# Patient Record
Sex: Male | Born: 1995 | Race: White | Hispanic: No | Marital: Single | Smoking: Never smoker
Health system: Southern US, Community
[De-identification: ages and names within clinical notes are randomized; demographics above are authoritative.]

## PROBLEM LIST (undated history)

## (undated) DIAGNOSIS — I639 Cerebral infarction, unspecified: Secondary | ICD-10-CM

## (undated) HISTORY — DX: Cerebral infarction, unspecified: I63.9

## (undated) HISTORY — PX: SHOULDER SURGERY: SHX246

---
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
## 2018-11-15 ENCOUNTER — Ambulatory Visit (HOSPITAL_COMMUNITY)
Admission: EM | Admit: 2018-11-15 | Discharge: 2018-11-15 | Disposition: A | Discharge status: Home / Self Care | Payer: BLUE CROSS/BLUE SHIELD | Attending: Internal Medicine | Admitting: Internal Medicine

## 2018-11-15 ENCOUNTER — Encounter (HOSPITAL_COMMUNITY): Payer: Self-pay

## 2018-11-15 ENCOUNTER — Other Ambulatory Visit: Payer: Self-pay

## 2018-11-15 DIAGNOSIS — B351 Tinea unguium: Secondary | ICD-10-CM | POA: Diagnosis not present

## 2018-11-15 MED ORDER — FLUCONAZOLE 200 MG PO TABS: 200.0000 mg | ORAL_TABLET | ORAL | 0 refills | Status: AC

## 2018-11-15 NOTE — ED Provider Notes (Signed)
[facility]    CSN: [id number] Arrival date & time: 11/15/18  1649     History   Chief Complaint Chief Complaint  Patient presents with  . Nail Problem    HPI Leonard Crawford is a 23 y.o. male.   Is a 23 year old male that presents with discoloration to the left great toe.  This has been present and worsening over the last 6 months.  He has been using over-the-counter antifungal medications without relief of symptoms.  He denies any pain or itching.  He denies any injury to the foot.  He denies any fever, chills, bites, sweats.  ROS per HPI      History reviewed. No pertinent past medical history.  There are no active problems to display for this patient.   History reviewed. No pertinent surgical history.     Home Medications    Prior to Admission medications   Medication Sig Start Date End Date Taking? Authorizing Provider  fluconazole (DIFLUCAN) 200 MG tablet Take 1 tablet (200 mg total) by mouth once a week. 11/15/18 05/02/19  Janace Aris, NP    Family History History reviewed. No pertinent family history.  Social History Social History   Tobacco Use  . Smoking status: Not on file  . Smokeless tobacco: Never Used  Substance Use Topics  . Alcohol use: Not on file  . Drug use: Not on file     Allergies   Patient has no allergy information on record.   Review of Systems Review of Systems   Physical Exam Triage Vital Signs ED Triage Vitals  Enc Vitals Group     BP 11/15/18 1751 128/89     Pulse Rate 11/15/18 1751 67     Resp 11/15/18 1751 18     Temp 11/15/18 1751 98.5 F (36.9 C)     Temp Source 11/15/18 1751 Oral     SpO2 11/15/18 1751 98 %     Weight 11/15/18 1753 240 lb (108.9 kg)     Height --      Head Circumference --      Peak Flow --      Pain Score 11/15/18 1752 1     Pain Loc --      Pain Edu? --      Excl. in GC? --    No data found.  Updated Vital Signs BP 128/89 (BP Location: Right Arm)   Pulse 67   Temp  98.5 F (36.9 C) (Oral)   Resp 18   Wt 240 lb (108.9 kg)   SpO2 98%   Visual Acuity Right Eye Distance:   Left Eye Distance:   Bilateral Distance:    Right Eye Near:   Left Eye Near:    Bilateral Near:     Physical Exam Vitals signs and nursing note reviewed.  Constitutional:      Appearance: Normal appearance.  HENT:     Head: Normocephalic and atraumatic.     Nose: Nose normal.  Eyes:     Conjunctiva/sclera: Conjunctivae normal.  Pulmonary:     Effort: Pulmonary effort is normal.  Musculoskeletal: Normal range of motion.  Skin:    General: Skin is warm and dry.     Comments: Yellow and dark discoloration to the left great toenail. No rashes, erythema.  Neurological:     Mental Status: He is alert.  Psychiatric:        Mood and Affect: Mood normal.      UC Treatments /  Results  Labs (all labs ordered are listed, but only abnormal results are displayed) Labs Reviewed - No data to display  EKG None  Radiology No results found.  Procedures Procedures (including critical care time)  Medications Ordered in UC Medications - No data to display  Initial Impression / Assessment and Plan / UC Course  I have reviewed the triage vital signs and the nursing notes.  Pertinent labs & imaging results that were available during my care of the patient were reviewed by me and considered in my medical decision making (see chart for details).     Onychomycosis We will treat with with fluconazole 2 mg once weekly for 6 months. This would be an easier treatment and cheaper for patient.  He is away at school. Instructed that his symptoms continue or do not improve with the medication he will need to follow-up with a dermatologist Final Clinical Impressions(s) / UC Diagnoses   Final diagnoses:  Onychomycosis     Discharge Instructions     We will treat you for the toenail infection with fluconazole.  You will take this medication once weekly for the next 6  months. For continued or worsening symptoms you need to follow-up with a dermatologist    ED Prescriptions    Medication Sig Dispense Auth. Provider   fluconazole (DIFLUCAN) 200 MG tablet Take 1 tablet (200 mg total) by mouth once a week. 24 tablet Dahlia ByesBast, Mont Jagoda A, NP     Controlled Substance Prescriptions [place] Controlled Substance Registry consulted? Not Applicable   Janace ArisBast, Airyanna Dipalma A, NP 11/15/18 1925

## 2018-11-15 NOTE — ED Triage Notes (Signed)
Pt cc left foot great toe nail  Issue this has been going on for months. The nail has a fungus.Leonard Crawford

## 2018-11-15 NOTE — Discharge Instructions (Signed)
We will treat you for the toenail infection with fluconazole.  You will take this medication once weekly for the next 6 months. For continued or worsening symptoms you need to follow-up with a dermatologist

## 2018-12-30 ENCOUNTER — Emergency Department (HOSPITAL_COMMUNITY): Payer: BLUE CROSS/BLUE SHIELD

## 2018-12-30 ENCOUNTER — Encounter (HOSPITAL_COMMUNITY): Payer: Self-pay | Admitting: Emergency Medicine

## 2018-12-30 ENCOUNTER — Other Ambulatory Visit: Payer: Self-pay

## 2018-12-30 ENCOUNTER — Inpatient Hospital Stay (HOSPITAL_COMMUNITY)
Admission: EM | Admit: 2018-12-30 | Discharge: 2019-01-01 | DRG: 093 | Disposition: A | Discharge status: Home / Self Care | Payer: BLUE CROSS/BLUE SHIELD | Attending: Internal Medicine | Admitting: Internal Medicine

## 2018-12-30 DIAGNOSIS — R51 Headache: Secondary | ICD-10-CM | POA: Diagnosis present

## 2018-12-30 DIAGNOSIS — R112 Nausea with vomiting, unspecified: Secondary | ICD-10-CM | POA: Diagnosis present

## 2018-12-30 DIAGNOSIS — E669 Obesity, unspecified: Secondary | ICD-10-CM | POA: Diagnosis present

## 2018-12-30 DIAGNOSIS — E860 Dehydration: Secondary | ICD-10-CM | POA: Diagnosis present

## 2018-12-30 DIAGNOSIS — G08 Intracranial and intraspinal phlebitis and thrombophlebitis: Secondary | ICD-10-CM | POA: Diagnosis present

## 2018-12-30 DIAGNOSIS — R030 Elevated blood-pressure reading, without diagnosis of hypertension: Secondary | ICD-10-CM | POA: Diagnosis present

## 2018-12-30 DIAGNOSIS — F17290 Nicotine dependence, other tobacco product, uncomplicated: Secondary | ICD-10-CM | POA: Diagnosis present

## 2018-12-30 DIAGNOSIS — Z6832 Body mass index (BMI) 32.0-32.9, adult: Secondary | ICD-10-CM | POA: Diagnosis not present

## 2018-12-30 DIAGNOSIS — R001 Bradycardia, unspecified: Secondary | ICD-10-CM | POA: Diagnosis not present

## 2018-12-30 DIAGNOSIS — I82409 Acute embolism and thrombosis of unspecified deep veins of unspecified lower extremity: Secondary | ICD-10-CM | POA: Diagnosis present

## 2018-12-30 DIAGNOSIS — Z79899 Other long term (current) drug therapy: Secondary | ICD-10-CM | POA: Diagnosis not present

## 2018-12-30 DIAGNOSIS — Z87820 Personal history of traumatic brain injury: Secondary | ICD-10-CM

## 2018-12-30 LAB — BASIC METABOLIC PANEL
ANION GAP: 9 (ref 5–15)
BUN: 9 mg/dL (ref 6–20)
CO2: 26 mmol/L (ref 22–32)
Calcium: 9.7 mg/dL (ref 8.9–10.3)
Chloride: 105 mmol/L (ref 98–111)
Creatinine, Ser: 1.04 mg/dL (ref 0.61–1.24)
GFR calc Af Amer: >60 mL/min (ref >60)
GFR calc non Af Amer: >60 mL/min (ref >60)
Glucose, Bld: 99 mg/dL (ref 70–99)
Potassium: 3.7 mmol/L (ref 3.5–5.1)
Sodium: 140 mmol/L (ref 135–145)

## 2018-12-30 LAB — CBC
HCT: 46.3 % (ref 39.0–52.0)
Hemoglobin: 15.5 g/dL (ref 13.0–17.0)
MCH: 28.5 pg (ref 26.0–34.0)
MCHC: 33.5 g/dL (ref 30.0–36.0)
MCV: 85.1 fL (ref 80.0–100.0)
NRBC: 0 % (ref 0.0–0.2)
Platelets: 226 10*3/uL (ref 150–400)
RBC: 5.44 MIL/uL (ref 4.22–5.81)
RDW: 12.6 % (ref 11.5–15.5)
WBC: 9.5 10*3/uL (ref 4.0–10.5)

## 2018-12-30 MED ORDER — HEPARIN (PORCINE) 25000 UT/250ML-% IV SOLN: 12.0000 [IU]/kg/h | INTRAVENOUS | Status: DC

## 2018-12-30 MED ORDER — PROCHLORPERAZINE EDISYLATE 10 MG/2ML IJ SOLN
10.0000 mg | Freq: Once | INTRAMUSCULAR | Status: AC
  Administered 2018-12-30: 10 mg via INTRAVENOUS
  Filled 2018-12-30: qty 2

## 2018-12-30 MED ORDER — DIPHENHYDRAMINE HCL 50 MG/ML IJ SOLN
12.5000 mg | Freq: Once | INTRAMUSCULAR | Status: AC
  Administered 2018-12-30: 12.5 mg via INTRAVENOUS
  Filled 2018-12-30: qty 1

## 2018-12-30 MED ORDER — IOHEXOL 300 MG/ML  SOLN
75.0000 mL | Freq: Once | INTRAMUSCULAR | Status: AC | PRN
  Administered 2018-12-30: 75 mL via INTRAVENOUS

## 2018-12-30 MED ORDER — KETOROLAC TROMETHAMINE 30 MG/ML IJ SOLN
30.0000 mg | Freq: Once | INTRAMUSCULAR | Status: AC
  Administered 2018-12-30: 30 mg via INTRAVENOUS
  Filled 2018-12-30: qty 1

## 2018-12-30 MED ORDER — HEPARIN (PORCINE) 25000 UT/250ML-% IV SOLN
1400.0000 [IU]/h | INTRAVENOUS | Status: DC
  Administered 2018-12-30: 1200 [IU]/h via INTRAVENOUS
  Administered 2018-12-31: 1400 [IU]/h via INTRAVENOUS
  Filled 2018-12-30 (×2): qty 250

## 2018-12-30 NOTE — ED Triage Notes (Signed)
Patient here with c/o severe HA with blurred vision today. Patient states he hit his head on his friends knee about a week ago and has been having migranes since Tuesday. HA Pain 7/10 at this time. Patient did not LOC when he hit his head, otherwise AOX4.

## 2018-12-30 NOTE — ED Notes (Signed)
Patient transported to CT 

## 2018-12-30 NOTE — Consult Note (Addendum)
NEURO HOSPITALIST CONSULT NOTE   Requestig physician: Dr. Lynelle Doctor  Reason for Consult: Venous sinus thromboses on CT venogram of head  History obtained from:  Patient and Chart     HPI:                                                                                                                                          Leonard Crawford is an 23 y.o. male presenting to the ED after experiencing severe 10/10 headache retroorbitally with onset about 1.5 days after striking the back of his head. The accident occurred about 1 week ago. His head had struck a friend's knee after falling backwards from a standing position during "horseplay". There was no LOC or change in mentation after striking his head. He thought that the headache was due to a migraine. However, he developed blurred vision today which prompted his visit to the ED.   CT head in the ED showed increased density within the posterior superior sagittal sinus, straight sinus, and right transverse sinus, which were felt to possibly represent dural venous sinus thrombosis. No parenchymal lesion was appreciated.   CTV of head was then obtained, confirming extensive venous thrombosis, involving the superior sagittal sinus, straight sinus, both transverse sinuses, the left sigmoid sinus, the left internal jugular vein and the left paravertebral vein.  Electrolyte panel and CBC in the ED were normal.   History reviewed. No pertinent past medical history.  History reviewed. No pertinent surgical history.  History reviewed. No pertinent family history.            Social History:  does not have a smoking history on file. He has never used smokeless tobacco. No history on file for alcohol and drug.  No Known Allergies  MEDICATIONS:                                                                                                                     Excedrin Migraine Diflucan Advil   ROS:  Positive for 4/10 headache behind eyes. No neck pain. Has had some trouble with high acuity visual tasks such as reading iPhone display. No CP, fever, SOB, dizziness, vertigo, confusion, trouble with speech, limb weakness, limb pain, limb numbness or urinary/bowel symptoms. Other ROS as per HPI.    Blood pressure (!) 122/55, pulse (!) 51, resp. rate 16, height 6' (1.829 m), weight 108.9 kg, SpO2 98 %.   General Examination:                                                                                                       Physical Exam  HEENT-  Normocephalic   Lungs- Respirations unlabored Extremities- No edema  Neurological Examination Mental Status: Alert, fully oriented, thought content appropriate.  Speech fluent with intact naming and comprehension. Basic math intact. No dysarthria. Pleasant and cooperative.  Cranial Nerves: II: Visual fields intact with no extinction to DSS. PERRL.   III,IV, VI: EOM are full. There consistently are about 5-6 beats of rapid low amplitude pendular nystagmus of left eye with repeated testing of upgaze. No ptosis.  V,VII: Smile symmetric, facial temp sensation normal bilaterally VIII: hearing intact to voice IX,X: Palate rises symmetrically XI: Symmetric XII: midline tongue extension Motor: Right : Upper extremity   5/5    Left:     Upper extremity   5/5  Lower extremity   5/5     Lower extremity   5/5 Normal tone throughout; no atrophy noted No pronator drift Sensory: Temp and light touch intact x 4 without extinction Deep Tendon Reflexes: 2+ and symmetric biceps, brachioradialis, patellae and achilles Plantars: Right: downgoing   Left: downgoing Cerebellar: No ataxia with FNF and H-S bilaterally Gait: Deferred   Lab Results: Basic Metabolic Panel: Recent Labs  Lab 12/30/18 2050  NA 140  K 3.7  CL 105  CO2 26  GLUCOSE 99  BUN 9   CREATININE 1.04  CALCIUM 9.7    CBC: Recent Labs  Lab 12/30/18 2050  WBC 9.5  HGB 15.5  HCT 46.3  MCV 85.1  PLT 226    Cardiac Enzymes: No results for input(s): CKTOTAL, CKMB, CKMBINDEX, TROPONINI in the last 168 hours.  Lipid Panel: No results for input(s): CHOL, TRIG, HDL, CHOLHDL, VLDL, LDLCALC in the last 168 hours.  Imaging: Ct Head Wo Contrast  Result Date: 12/30/2018 CLINICAL DATA:  23 y/o  M; severe persistent headache. EXAM: CT HEAD WITHOUT CONTRAST TECHNIQUE: Contiguous axial images were obtained from the base of the skull through the vertex without intravenous contrast. COMPARISON:  None. FINDINGS: Brain: No evidence of acute infarction, hemorrhage, hydrocephalus, extra-axial collection or mass lesion/mass effect. Vascular: Asymmetric increased density within the posterior superior sagittal sinus, straight sinus, and right transverse sinus. Skull: Normal. Negative for fracture or focal lesion. Sinuses/Orbits: No acute finding. Other: None. IMPRESSION: 1. No evidence of acute intracranial abnormality. 2. Increased density within the posterior superior sagittal sinus, straight sinus, and right transverse sinus. Findings may represent dural venous sinus thrombosis. Head CTV or MRV is recommended. These results were called by telephone at the  time of interpretation on 12/30/2018 at 8:27 pm to Dr. JON KNAPP , who verbally acknowledged these results. Electronically Signed   By: Lance  Furusawa-Stratton M.D.   On: 12/30/2018 20:28   Ct Venogram Head  Result Date: 12/30/2018 CLINICAL DATA:  Headache with blurry vision. Migraines. Recent head trauma. EXAM: CT VENOGRAM HEAD TECHNIQUE: Vena graphic images of the head were obtained following administration of intravenous contrast. CONTRAST:  [id number] CountyJesse Brown Va Medical Center - Va ChiDurene CAAvenues Surgical CMinerva AreolVerdHermitage Tn Endoscopy Asc LL469-[facility] [id number] Center OTDurene CASnoqualmie Valley HosMinerva AreolVerdAspirus Keweenaw Hospital(365) [id number] HoLake CountrDurene CACypress Pointe Surgical HosMinerva AreolVerdAdvanced Surgery Center Of Lancaste44Pmg Kaseman [id number] Behavioral HeaSouthDurene CAVision Group AsMinerva AreolVerdDoctors Outpatient Center For Sur986 Shriners' Hospital For [id number] Hospital Durene CASt. Louis Children'S HosMinerva AreolVerdU35[facility]5725Lawera31mc829Three Rivers EnMayo Clinic Health SystemDurene CANorwood HltMinerva AreolVerdWilliam J Mccord Adolescent Treatment920-[facility] [id number]-FairvieThe Rehabilitation Durene CARiverview Regional Medical CMinerva AreolVerdSouthwest Florida Institute Of Ambulatory 72[facility] 5467Lawera50Staten IslSwedish Medical Center - RedmoMinerva Areol82md829Fillmore CommunBaptist Memorial HoDurene CAEast Alabama Medical CMinerva AreolVerdSt. Francis Medic843-Speciality [facility] [id number] Island UniversitUhs BinghDurene CALogan Memorial HosMinerva AreolVerdMadison Hosp608 [facility]8134Lawera38mc829MinneolaHoly Redeemer AmbulatDurene CAOutpatient Surgery Center Of JonesborMinerva AreolVerdReading640 [facility] [id number] OutpatiSouthern California HDurene CANortheast Alabama Regional Medical CMinerva AreolVerdCommunity Medical Center(571)[facility]53Lawera65mDurene CAEncompass Health Rehabilitation Hospital Of Minerva AreolVerdTexas Health Surgery Cente501-[facility] Simi Vall5985Lawerance Cruel33ovanniapletal0 MG/ML  SOLN COMPARISON:  Head CT 12/30/2018 FINDINGS: There is a large filling defect within the posterior superior sagittal sinus and within the straight sinus. The  superior sagittal sinus has expanded in size since the earlier examination. Filling defect extends into both transverse sinuses and the left sigmoid sinus. Additionally, there central filling defects within the left internal jugular vein and left paravertebral vein (series 3, image 10). There is normal opacification of the anterior half of superior sagittal sinus. IMPRESSION: Confirmation of extensive venous thrombosis, involving the superior sagittal sinus, straight sinus, both transverse sinuses, the left sigmoid sinus, the left internal jugular vein and the left paravertebral vein. Critical Value/emergent results were called by telephone at the time of interpretation on 12/30/2018 at 10:41 pm to Dr. JON KNAPP , who verbally acknowledged these results. Electronically Signed   By: Kevin  Herman M.D.   On: 12/30/2018 22:41    Assessment: 23 year old male with extensive venous thrombosis, involving the superior sagittal sinus, straight sinus, both transverse sinuses, the left sigmoid sinus, the left internal jugular vein and the left paravertebral vein. 1. Thrombosis most likely precipitated by fall during which he struck his head approximately 7 days ago 2. Neurological exam without deficits except for pendular nystagmus of left eye with upgaze.   Recommendations: 1. Hypercoagulable panel 2. Lupus panel 3. Start heparin with no bolus. Will subsequently need to be started on an oral anticoagulant.  4. MRI brain with and without contrast 5. Frequent neuro checks 6. Headache management PRN 7. Stroke team to follow in AM.   Electronically signed: Dr. Kito Cuffe 12/30/2018, 10:54 PM

## 2018-12-30 NOTE — ED Provider Notes (Signed)
[facility] EMERGENCY DEPARTMENT Provider Note   CSN: [id number] Arrival date & time: 12/30/18  1920    History   Chief Complaint Headache  HPI Leonard Crawford is a 23 y.o. male.     HPI Patient presents to the emergency room for evaluation of a headache.  Patient states initially the symptoms started a couple weeks ago.  He was wrestling and got kneed in the head.  Patient states ever since then he has had a persistent headache.  A few days ago became more severe where he had some photophobia and visual disturbance.  Headaches in the frontal area.  Does not radiate.  He has no neck stiffness.  No numbness or weakness.  Patient does have a history of recurrent headaches although he is never been formally diagnosed with migraines.  Patient usually will have a headache started to come on and he can take some ibuprofen and take a nap and the headache will go away.  Went to an urgent care this evening and they recommended a head CT because of his recent head injury. History reviewed. No pertinent past medical history.  Patient Active Problem List   Diagnosis Date Noted  . DVT (deep venous thrombosis) (HCC) 12/30/2018    History reviewed. No pertinent surgical history.      Home Medications    Prior to Admission medications   Medication Sig Start Date End Date Taking? Authorizing Provider  aspirin-acetaminophen-caffeine (EXCEDRIN MIGRAINE) [phone] MG tablet Take 2 tablets by mouth every 6 (six) hours as needed for headache or migraine.   Yes [provider]  fluconazole (DIFLUCAN) 200 MG tablet Take 1 tablet (200 mg total) by mouth once a week. Patient taking differently: Take 200 mg by mouth every Monday.  11/15/18 05/02/19 Yes Bast, Traci A, NP  ibuprofen (ADVIL,MOTRIN) 200 MG tablet Take 800 mg by mouth every 6 (six) hours as needed (for pain or migraines).   Yes [provider]    Family History History reviewed. No pertinent family  history.  Social History Social History   Tobacco Use  . Smoking status: Not on file  . Smokeless tobacco: Never Used  Substance Use Topics  . Alcohol use: Not on file  . Drug use: Not on file     Allergies   Patient has no known allergies.   Review of Systems Review of Systems  All other systems reviewed and are negative.    Physical Exam Updated Vital Signs BP (!) 122/55   Pulse (!) 51   Resp 16   Ht 1.829 m (6')   Wt 108.9 kg   SpO2 98%   BMI 32.55 kg/m   Physical Exam Vitals signs and nursing note reviewed.  Constitutional:      General: He is not in acute distress.    Appearance: He is well-developed.  HENT:     Head: Normocephalic and atraumatic.     Right Ear: External ear normal.     Left Ear: External ear normal.  Eyes:     General: No scleral icterus.       Right eye: No discharge.        Left eye: No discharge.     Conjunctiva/sclera: Conjunctivae normal.  Neck:     Musculoskeletal: Neck supple.     Trachea: No tracheal deviation.  Cardiovascular:     Rate and Rhythm: Normal rate and regular rhythm.  Pulmonary:     Effort: Pulmonary effort is normal. No respiratory distress.  Breath sounds: Normal breath sounds. No stridor. No wheezing or rales.  Abdominal:     General: Bowel sounds are normal. There is no distension.     Palpations: Abdomen is soft.     Tenderness: There is no abdominal tenderness. There is no guarding or rebound.  Musculoskeletal:        General: No tenderness.  Skin:    General: Skin is warm and dry.     Findings: No rash.  Neurological:     Mental Status: He is alert.     Cranial Nerves: No cranial nerve deficit (no facial droop, extraocular movements intact, no slurred speech).     Sensory: No sensory deficit.     Motor: No abnormal muscle tone or seizure activity.     Coordination: Coordination normal.      ED Treatments / Results  Labs (all labs ordered are listed, but only abnormal results are  displayed) Labs Reviewed  CBC  BASIC METABOLIC PANEL  ANTITHROMBIN III  PROTEIN C ACTIVITY  PROTEIN C, TOTAL  PROTEIN S ACTIVITY  PROTEIN S, TOTAL  LUPUS ANTICOAGULANT PANEL  BETA-2-GLYCOPROTEIN I ABS, IGG/M/A  HOMOCYSTEINE  FACTOR 5 LEIDEN  PROTHROMBIN GENE MUTATION  CARDIOLIPIN ANTIBODIES, IGG, IGM, IGA  PROTIME-INR  APTT  HEPARIN LEVEL (UNFRACTIONATED)  CBC    EKG None  Radiology Ct Head Wo Contrast  Result Date: 12/30/2018 CLINICAL DATA:  23 y/o  M; severe persistent headache. EXAM: CT HEAD WITHOUT CONTRAST TECHNIQUE: Contiguous axial images were obtained from the base of the skull through the vertex without intravenous contrast. COMPARISON:  None. FINDINGS: Brain: No evidence of acute infarction, hemorrhage, hydrocephalus, extra-axial collection or mass lesion/mass effect. Vascular: Asymmetric increased density within the posterior superior sagittal sinus, straight sinus, and right transverse sinus. Skull: Normal. Negative for fracture or focal lesion. Sinuses/Orbits: No acute finding. Other: None. IMPRESSION: 1. No evidence of acute intracranial abnormality. 2. Increased density within the posterior superior sagittal sinus, straight sinus, and right transverse sinus. Findings may represent dural venous sinus thrombosis. Head CTV or MRV is recommended. These results were called by telephone at the time of interpretation on 12/30/2018 at 8:27 pm to Dr. Linwood Dibbles , who verbally acknowledged these results. Electronically Signed   By: Mitzi Hansen M.D.   On: 12/30/2018 20:28   Ct Venogram Head  Result Date: 12/30/2018 CLINICAL DATA:  Headache with blurry vision. Migraines. Recent head trauma. EXAM: CT VENOGRAM HEAD TECHNIQUE: Vena graphic images of the head were obtained following administration of intravenous contrast. CONTRAST:  38mL OMNIPAQUE IOHEXOL 300 MG/ML  SOLN COMPARISON:  Head CT 12/30/2018 FINDINGS: There is a large filling defect within the posterior superior  sagittal sinus and within the straight sinus. The superior sagittal sinus has expanded in size since the earlier examination. Filling defect extends into both transverse sinuses and the left sigmoid sinus. Additionally, there central filling defects within the left internal jugular vein and left paravertebral vein (series 3, image 10). There is normal opacification of the anterior half of superior sagittal sinus. IMPRESSION: Confirmation of extensive venous thrombosis, involving the superior sagittal sinus, straight sinus, both transverse sinuses, the left sigmoid sinus, the left internal jugular vein and the left paravertebral vein. Critical Value/emergent results were called by telephone at the time of interpretation on 12/30/2018 at 10:41 pm to Dr. Linwood Dibbles , who verbally acknowledged these results. Electronically Signed   By: Deatra Robinson M.D.   On: 12/30/2018 22:41    Procedures .Critical Care Performed by: Lynelle Doctor,  Cletis AthensJon, MD Authorized by: Linwood DibblesKnapp, Janai Maudlin, MD   Critical care provider statement:    Critical care time (minutes):  30   Critical care was time spent personally by me on the following activities:  Discussions with consultants, evaluation of patient's response to treatment, examination of patient, ordering and performing treatments and interventions, ordering and review of laboratory studies, ordering and review of radiographic studies, pulse oximetry, re-evaluation of patient's condition, obtaining history from patient or surrogate and review of old charts   (including critical care time)  Medications Ordered in ED Medications  heparin ADULT infusion 100 units/mL (25000 units/23850mL sodium chloride 0.45%) (has no administration in time range)  prochlorperazine (COMPAZINE) injection 10 mg (10 mg Intravenous Given 12/30/18 1951)  diphenhydrAMINE (BENADRYL) injection 12.5 mg (12.5 mg Intravenous Given 12/30/18 1951)  ketorolac (TORADOL) 30 MG/ML injection 30 mg (30 mg Intravenous Given 12/30/18  2119)  iohexol (OMNIPAQUE) 300 MG/ML solution 75 mL (75 mLs Intravenous Contrast Given 12/30/18 2205)     Initial Impression / Assessment and Plan / ED Course  I have reviewed the triage vital signs and the nursing notes.  Pertinent labs & imaging results that were available during my care of the patient were reviewed by me and considered in my medical decision making (see chart for details).  Clinical Course as of Dec 30 2306  Thu Dec 30, 2018  2254 Discussed the case with Dr. Otelia LimesLindzen.  He recommends heparin infusion without bolus   [JK]  2307 Discussed case with Dr. Toniann FailKakrakandy   [JK]    Clinical Course User Index [JK] Linwood DibblesKnapp, Mozelle Remlinger, MD     Patient presented to the emergency room for evaluation of a headache.  Patient symptoms were suggestive of possible migraine type headache with the photophobia and visual aura however he did have a recent head injury which precipitated this headache.  Because of the persistent headache after a traumatic head injury CT scan was performed.  CT scan suggest the possibility of venous thrombosis.  CT venogram was ordered and this does confirm extensive venous thrombosis.  Patient does not have any known underlying medical conditions to precipitate this.  He will need a hypercoagulability work-up.  IV heparin has been ordered.  Patient will be admitted to the hospital for further treatment and evaluation. Final Clinical Impressions(s) / ED Diagnoses   Final diagnoses:  Cerebral venous thrombosis      Linwood DibblesKnapp, Terre Hanneman, MD 12/30/18 2308

## 2018-12-30 NOTE — Progress Notes (Signed)
ANTICOAGULATION CONSULT NOTE - Initial Consult  Pharmacy Consult for Heparin Indication: cerebral venous thrombosis  No Known Allergies  Patient Measurements: Height: 6' (182.9 cm) Weight: 240 lb (108.9 kg) IBW/kg (Calculated) : 77.6 Heparin Dosing Weight: 101 kg  Vital Signs: BP: 122/55 (02/20 2115) Pulse Rate: 51 (02/20 2115)  Labs: Recent Labs    12/30/18 2050  HGB 15.5  HCT 46.3  PLT 226  CREATININE 1.04    Estimated Creatinine Clearance: 143.2 mL/min (by C-G formula based on SCr of 1.04 mg/dL).   Medical History: History reviewed. No pertinent past medical history.  Medications:  See electronic med rec  Assessment: 23 y.o. presents with headache. Found to have extensive cerebral vein thrombosis on CT venogram. To begin heparin with no bolus and lower goal rate of 0.3-0.5 units/ml. No AC PTA. CBC stable.  Goal of Therapy:  Heparin level 0.3-0.5 units/ml Monitor platelets by anticoagulation protocol: Yes   Plan:  Heparin 1200 units/hr  Will f/u 8 hr heparin level Daily heparin level and CBC  Christoper Fabian, PharmD, BCPS Clinical pharmacist  **Pharmacist phone directory can now be found on [email or web address] ([email or web address]).  Listed under [facility]. 12/30/2018,11:03 PM

## 2018-12-31 ENCOUNTER — Encounter (HOSPITAL_COMMUNITY): Payer: Self-pay | Admitting: Internal Medicine

## 2018-12-31 ENCOUNTER — Inpatient Hospital Stay (HOSPITAL_COMMUNITY): Payer: BLUE CROSS/BLUE SHIELD

## 2018-12-31 DIAGNOSIS — G08 Intracranial and intraspinal phlebitis and thrombophlebitis: Secondary | ICD-10-CM

## 2018-12-31 DIAGNOSIS — R030 Elevated blood-pressure reading, without diagnosis of hypertension: Secondary | ICD-10-CM | POA: Diagnosis present

## 2018-12-31 LAB — PROTIME-INR
INR: 1.08
Prothrombin Time: 13.9 seconds (ref 11.4–15.2)

## 2018-12-31 LAB — HEPATIC FUNCTION PANEL
ALK PHOS: 74 U/L (ref 38–126)
ALT: 30 U/L (ref 0–44)
AST: 21 U/L (ref 15–41)
Albumin: 3.9 g/dL (ref 3.5–5.0)
Bilirubin, Direct: 0.1 mg/dL (ref 0.0–0.2)
Indirect Bilirubin: 0.3 mg/dL (ref 0.3–0.9)
Total Bilirubin: 0.4 mg/dL (ref 0.3–1.2)
Total Protein: 7 g/dL (ref 6.5–8.1)

## 2018-12-31 LAB — CBC
HCT: 42.5 % (ref 39.0–52.0)
HEMOGLOBIN: 14.5 g/dL (ref 13.0–17.0)
MCH: 28.8 pg (ref 26.0–34.0)
MCHC: 34.1 g/dL (ref 30.0–36.0)
MCV: 84.3 fL (ref 80.0–100.0)
NRBC: 0 % (ref 0.0–0.2)
Platelets: 196 10*3/uL (ref 150–400)
RBC: 5.04 MIL/uL (ref 4.22–5.81)
RDW: 12.5 % (ref 11.5–15.5)
WBC: 6.5 10*3/uL (ref 4.0–10.5)

## 2018-12-31 LAB — BASIC METABOLIC PANEL
Anion gap: 9 (ref 5–15)
BUN: 12 mg/dL (ref 6–20)
CO2: 27 mmol/L (ref 22–32)
Calcium: 9.4 mg/dL (ref 8.9–10.3)
Chloride: 104 mmol/L (ref 98–111)
Creatinine, Ser: 1.05 mg/dL (ref 0.61–1.24)
GFR calc Af Amer: >60 mL/min (ref >60)
GFR calc non Af Amer: >60 mL/min (ref >60)
Glucose, Bld: 124 mg/dL — ABNORMAL HIGH (ref 70–99)
Potassium: 4 mmol/L (ref 3.5–5.1)
Sodium: 140 mmol/L (ref 135–145)

## 2018-12-31 LAB — HIV ANTIBODY (ROUTINE TESTING W REFLEX): HIV Screen 4th Generation wRfx: NONREACTIVE

## 2018-12-31 LAB — HEPARIN LEVEL (UNFRACTIONATED)
Heparin Unfractionated: 0.23 IU/mL — ABNORMAL LOW (ref 0.30–0.70)
Heparin Unfractionated: 0.38 IU/mL (ref 0.30–0.70)
Heparin Unfractionated: 0.36 IU/mL (ref 0.30–0.70)

## 2018-12-31 LAB — ANTITHROMBIN III: AntiThromb III Func: 104 % (ref 75–120)

## 2018-12-31 LAB — APTT: aPTT: 32 seconds (ref 24–36)

## 2018-12-31 MED ORDER — SODIUM CHLORIDE 0.9 % IV SOLN
INTRAVENOUS | Status: DC
  Administered 2018-12-31: 01:00:00 via INTRAVENOUS

## 2018-12-31 MED ORDER — FLUCONAZOLE 100 MG PO TABS: 200.0000 mg | ORAL_TABLET | ORAL | Status: DC

## 2018-12-31 MED ORDER — TRAMADOL HCL 50 MG PO TABS
50.0000 mg | ORAL_TABLET | Freq: Four times a day (QID) | ORAL | Status: DC | PRN
  Administered 2018-12-31 – 2019-01-01 (×3): 50 mg via ORAL
  Filled 2018-12-31 (×3): qty 1

## 2018-12-31 MED ORDER — ZOLPIDEM TARTRATE 5 MG PO TABS
5.0000 mg | ORAL_TABLET | Freq: Once | ORAL | Status: AC
  Administered 2018-12-31: 5 mg via ORAL
  Filled 2018-12-31: qty 1

## 2018-12-31 MED ORDER — ACETAMINOPHEN 325 MG PO TABS: 650.0000 mg | ORAL_TABLET | Freq: Four times a day (QID) | ORAL | Status: DC | PRN

## 2018-12-31 MED ORDER — ACETAMINOPHEN 650 MG RE SUPP: 650.0000 mg | Freq: Four times a day (QID) | RECTAL | Status: DC | PRN

## 2018-12-31 MED ORDER — GADOBUTROL 1 MMOL/ML IV SOLN
10.0000 mL | Freq: Once | INTRAVENOUS | Status: AC | PRN
  Administered 2018-12-31: 10 mL via INTRAVENOUS

## 2018-12-31 MED ORDER — ONDANSETRON HCL 4 MG/2ML IJ SOLN
4.0000 mg | Freq: Four times a day (QID) | INTRAMUSCULAR | Status: DC | PRN
  Administered 2019-01-01: 4 mg via INTRAVENOUS
  Filled 2018-12-31: qty 2

## 2018-12-31 MED ORDER — LORAZEPAM 2 MG/ML IJ SOLN
1.0000 mg | Freq: Once | INTRAMUSCULAR | Status: AC
  Administered 2018-12-31: 1 mg via INTRAVENOUS
  Filled 2018-12-31: qty 1

## 2018-12-31 MED ORDER — SODIUM CHLORIDE 0.9 % IV SOLN
INTRAVENOUS | Status: AC
  Administered 2018-12-31 – 2019-01-01 (×2): via INTRAVENOUS

## 2018-12-31 MED ORDER — KETOROLAC TROMETHAMINE 15 MG/ML IJ SOLN
15.0000 mg | Freq: Four times a day (QID) | INTRAMUSCULAR | Status: AC | PRN
  Administered 2018-12-31 (×3): 15 mg via INTRAVENOUS
  Filled 2018-12-31 (×2): qty 1

## 2018-12-31 MED ORDER — ONDANSETRON HCL 4 MG PO TABS: 4.0000 mg | ORAL_TABLET | Freq: Four times a day (QID) | ORAL | Status: DC | PRN

## 2018-12-31 NOTE — Care Management (Signed)
#    7.   S/W [facility] @ [facility]  # [phone] OPT- MEMBER   PRADAXA  150 MG BID COVER- NOT COVER PRIOR APPROVAL- YES # [phone]   PREFERRED ALTERNATIVE: 1. ELIQUIS  2.5 MG  BID   COVER- YES CO-PAY- $ 40.00 TIER- PREFERRED PRIOR APPROVAL- NO  2. ELIQUIS  5 MG BID COVER- YES CO-PAY- $ 40.00 TIER- PREFERRED PRIOR APPROVAL- NO  3. XARELTO 15 MG BID  COVER- YES CO-PAY- $ 40.00  TIER- PREFERRED  PRIOR APPROVAL- NO  4. XARELTO  20 MG DAILY COVER- YES CO-PAY-$ 40.00 TIER-PREFERRED PRIOR APPROVAL- NO   PREFERRED PHARMACY : YES [facility]

## 2018-12-31 NOTE — H&P (Addendum)
History and Physical    Leonard DuckingSeth A Meckley UJW:[id number]:[id number] DOB: [birth year] DOA: 12/30/2018  PCP: Patient, No Pcp Per  Patient coming from: Home.  Chief Complaint: Headache.  HPI: Leonard Crawford is a 23 y.o. male with history of migraine presents to the ER because of persistent headache nausea vomiting.  Patient hit his head about 2-1/2 weeks ago while playing with his friends.  Since then he has been having some occipital headache.  Over the last few days the headache worsened mostly in the retro-orbital area with persistent nausea vomiting which was not getting better and was more than his usual headache from migraine.  ED Course: In the ER patient appeared nonfocal.  CT head showed features concerning for venous sinus thrombosis for which CT venogram was done.  Confirmed venous sinus thrombosis.  On-call neurologist Dr. Caryl PinaEric Lindzen was consulted and patient was started on heparin infusion.  For headache patient was given Toradol which improved the headache.  Review of Systems: As per HPI, rest all negative.   History reviewed. No pertinent past medical history.  History reviewed. No pertinent surgical history.   reports that he has never smoked. He has never used smokeless tobacco. No history on file for alcohol and drug.  No Known Allergies  Family history -maternal grandfather had coronary artery disease.  Aunt had DVT when she was wearing a cast.  Prior to Admission medications   Medication Sig Start Date End Date Taking? Authorizing Provider  aspirin-acetaminophen-caffeine (EXCEDRIN MIGRAINE) [id number] MG tablet Take 2 tablets by mouth every 6 (six) hours as needed for headache or migraine.   Yes [provider]  fluconazole (DIFLUCAN) 200 MG tablet Take 1 tablet (200 mg total) by mouth once a week. Patient taking differently: Take 200 mg by mouth every Monday.  11/15/18 05/02/19 Yes Bast, Traci A, NP  ibuprofen (ADVIL,MOTRIN) 200 MG tablet Take 800 mg by mouth every 6 (six)  hours as needed (for pain or migraines).   Yes [provider]    Physical Exam: Vitals:   12/30/18 2115 12/30/18 2319 12/30/18 2320 12/31/18 0003  BP: (!) 122/55 (!) 159/82  (!) 161/94  Pulse: (!) 51  63 65  Resp: 16   20  Temp:    98 F (36.7 C)  TempSrc:    Oral  SpO2: 98%  98% 95%  Weight:      Height:          Constitutional: Moderately built and nourished. Vitals:   12/30/18 2115 12/30/18 2319 12/30/18 2320 12/31/18 0003  BP: (!) 122/55 (!) 159/82  (!) 161/94  Pulse: (!) 51  63 65  Resp: 16   20  Temp:    98 F (36.7 C)  TempSrc:    Oral  SpO2: 98%  98% 95%  Weight:      Height:       Eyes: Anicteric no pallor. ENMT: No discharge from the ears eyes nose and mouth. Neck: No mass felt.  No neck rigidity. Respiratory: No rhonchi or crepitations. Cardiovascular: S1-S2 heard. Abdomen: Soft nontender bowel sounds present. Musculoskeletal: No edema.  No joint effusion. Skin: No rash. Neurologic: Alert awake oriented to time place and person.  Moves all extremities. Psychiatric: Appears normal per normal affect.   Labs on Admission: I have personally reviewed following labs and imaging studies  CBC: Recent Labs  Lab 12/30/18 2050  WBC 9.5  HGB 15.5  HCT 46.3  MCV 85.1  PLT 226   Basic Metabolic Panel:  Recent Labs  Lab 12/30/18 2050  NA 140  K 3.7  CL 105  CO2 26  GLUCOSE 99  BUN 9  CREATININE 1.04  CALCIUM 9.7   GFR: Estimated Creatinine Clearance: 143.2 mL/min (by C-G formula based on SCr of 1.04 mg/dL). Liver Function Tests: No results for input(s): AST, ALT, ALKPHOS, BILITOT, PROT, ALBUMIN in the last 168 hours. No results for input(s): LIPASE, AMYLASE in the last 168 hours. No results for input(s): AMMONIA in the last 168 hours. Coagulation Profile: No results for input(s): INR, PROTIME in the last 168 hours. Cardiac Enzymes: No results for input(s): CKTOTAL, CKMB, CKMBINDEX, TROPONINI in the last 168 hours. BNP (last 3  results) No results for input(s): PROBNP in the last 8760 hours. HbA1C: No results for input(s): HGBA1C in the last 72 hours. CBG: No results for input(s): GLUCAP in the last 168 hours. Lipid Profile: No results for input(s): CHOL, HDL, LDLCALC, TRIG, CHOLHDL, LDLDIRECT in the last 72 hours. Thyroid Function Tests: No results for input(s): TSH, T4TOTAL, FREET4, T3FREE, THYROIDAB in the last 72 hours. Anemia Panel: No results for input(s): VITAMINB12, FOLATE, FERRITIN, TIBC, IRON, RETICCTPCT in the last 72 hours. Urine analysis: No results found for: COLORURINE, APPEARANCEUR, LABSPEC, PHURINE, GLUCOSEU, HGBUR, BILIRUBINUR, KETONESUR, PROTEINUR, UROBILINOGEN, NITRITE, LEUKOCYTESUR Sepsis Labs: @LABRCNTIP (procalcitonin:4,lacticidven:4) )No results found for this or any previous visit (from the past 240 hour(s)).   Radiological Exams on Admission: Ct Head Wo Contrast  Result Date: 12/30/2018 CLINICAL DATA:  23 y/o  M; severe persistent headache. EXAM: CT HEAD WITHOUT CONTRAST TECHNIQUE: Contiguous axial images were obtained from the base of the skull through the vertex without intravenous contrast. COMPARISON:  None. FINDINGS: Brain: No evidence of acute infarction, hemorrhage, hydrocephalus, extra-axial collection or mass lesion/mass effect. Vascular: Asymmetric increased density within the posterior superior sagittal sinus, straight sinus, and right transverse sinus. Skull: Normal. Negative for fracture or focal lesion. Sinuses/Orbits: No acute finding. Other: None. IMPRESSION: 1. No evidence of acute intracranial abnormality. 2. Increased density within the posterior superior sagittal sinus, straight sinus, and right transverse sinus. Findings may represent dural venous sinus thrombosis. Head CTV or MRV is recommended. These results were called by telephone at the time of interpretation on 12/30/2018 at 8:27 pm to Dr. Linwood Dibbles , who verbally acknowledged these results. Electronically Signed   By:  Mitzi Hansen M.D.   On: 12/30/2018 20:28   Ct Venogram Head  Result Date: 12/30/2018 CLINICAL DATA:  Headache with blurry vision. Migraines. Recent head trauma. EXAM: CT VENOGRAM HEAD TECHNIQUE: Vena graphic images of the head were obtained following administration of intravenous contrast. CONTRAST:  64mL OMNIPAQUE IOHEXOL 300 MG/ML  SOLN COMPARISON:  Head CT 12/30/2018 FINDINGS: There is a large filling defect within the posterior superior sagittal sinus and within the straight sinus. The superior sagittal sinus has expanded in size since the earlier examination. Filling defect extends into both transverse sinuses and the left sigmoid sinus. Additionally, there central filling defects within the left internal jugular vein and left paravertebral vein (series 3, image 10). There is normal opacification of the anterior half of superior sagittal sinus. IMPRESSION: Confirmation of extensive venous thrombosis, involving the superior sagittal sinus, straight sinus, both transverse sinuses, the left sigmoid sinus, the left internal jugular vein and the left paravertebral vein. Critical Value/emergent results were called by telephone at the time of interpretation on 12/30/2018 at 10:41 pm to Dr. Linwood Dibbles , who verbally acknowledged these results. Electronically Signed   By: Chrisandra Netters.D.  On: 12/30/2018 22:41      Assessment/Plan Principal Problem:   Intracranial venous thrombosis Active Problems:   DVT (deep venous thrombosis) (HCC)   Elevated blood pressure reading    1. Extensive intracranial DVT -appreciate neurology consult.  Patient is placed on heparin.  On Toradol for pain relief.  Hydration.  Hypercoagulable work-up.  Patient's aunt had a DVT but at that time she was wearing a cast. 2. Elevated blood pressure reading closely follow blood pressure trends. 3. History of migraine. 4. On fluconazole every Monday for toenail infection.   DVT prophylaxis: Heparin infusion. Code  Status: Full code. Family Communication: Patient's mother. Disposition Plan: Home. Consults called: Neurology. Admission status: Inpatient.   Eduard Clos MD [facility] Pager [phone].  If 7PM-7AM, please contact night-coverage [email or web address] Password [facility]  12/31/2018, 12:07 AM

## 2018-12-31 NOTE — Care Management (Signed)
#    9.   S/W  DAVID    @    [facility] # [phone] OPT- MEMBER   1. LOVENOX  110 MG BID  FOR 7 DAYS  14 SYRINGES COVER- YES CO-PAY- $ 40.00 TIER- NO PRIOR APPROVAL- NO  2. ENOXAPARIN 110 MG  BID FOR 7 DAYS 14 SYRINGES COVER- YES CO-PAY- $ 10.00 TIER-NO PRIOR APPROVAL- NO  PREFERRED PHARMACY : YES [facility]

## 2018-12-31 NOTE — Care Management (Addendum)
Case manager worked on locating a Administrator, arts that would be able to follow patient on Lovenox/coumadin bridge. During the course of this search, Dr. Pearlean Brownie explained to patient and family that he was going to allow patient to discharge on Elaquis. Case manager was able to get an appointment to establish a primary medical Home with [facility], clinical. CM provided patient's parents with the appointment date and time, did inform them that they can call office to check for cancellations and has also placed the information on AVS. Dr. Waymon Amato was notified as well.

## 2018-12-31 NOTE — Progress Notes (Addendum)
STROKE TEAM PROGRESS NOTE   INTERVAL HISTORY His parents and attending are at the bedside.  Patient with traumatic injury 1 week ago during 'horseplay' where he hit his head on his friends knee. Decreased po intake past 1 week with dehydration.   Vitals:   12/30/18 2115 12/30/18 2319 12/30/18 2320 12/31/18 0003  BP: (!) 122/55 (!) 159/82  (!) 161/94  Pulse: (!) 51  63 65  Resp: 16   20  Temp:    98 F (36.7 C)  TempSrc:    Oral  SpO2: 98%  98% 95%  Weight:      Height:        CBC:  Recent Labs  Lab 12/30/18 2050 12/31/18 0729  WBC 9.5 6.5  HGB 15.5 14.5  HCT 46.3 42.5  MCV 85.1 84.3  PLT 226 196    Basic Metabolic Panel:  Recent Labs  Lab 12/30/18 2050 12/31/18 0729  NA 140 140  K 3.7 4.0  CL 105 104  CO2 26 27  GLUCOSE 99 124*  BUN 9 12  CREATININE 1.04 1.05  CALCIUM 9.7 9.4   Lipid Panel: No results found for: CHOL, TRIG, HDL, CHOLHDL, VLDL, LDLCALC HgbA1c: No results found for: HGBA1C Urine Drug Screen: No results found for: LABOPIA, COCAINSCRNUR, LABBENZ, AMPHETMU, THCU, LABBARB  Alcohol Level No results found [facility]: [facility]  IMAGING Ct Head Wo Contrast  Result Date: 12/30/2018 CLINICAL DATA:  [age] y/o  M; severe persistent headache. EXAM: CT HEAD WITHOUT CONTRAST TECHNIQUE: Contiguous axial images were obtained from the base of the skull through the vertex without intravenous contrast. COMPARISON:  None. FINDINGS: Brain: No evidence of acute infarction, hemorrhage, hydrocephalus, extra-axial collection or mass lesion/mass effect. Vascular: Asymmetric increased density within the posterior superior sagittal sinus, straight sinus, and right transverse sinus. Skull: Normal. Negative for fracture or focal lesion. Sinuses/Orbits: No acute finding. Other: None. IMPRESSION: 1. No evidence of acute intracranial abnormality. 2. Increased density within the posterior superior sagittal sinus, straight sinus, and right transverse sinus. Findings may represent dural venous sinus  thrombosis. Head CTV or MRV is recommended. These results were called by telephone at the time of interpretation on 12/30/2018 at 8:27 pm to Dr. Linwood DibblesJON KNAPP , who verbally acknowledged these results. Electronically Signed   By: Mitzi HansenLance  Furusawa-Stratton M.D.   On: 12/30/2018 20:28   Ct Venogram Head  Result Date: 12/30/2018 CLINICAL DATA:  Headache with blurry vision. Migraines. Recent head trauma. EXAM: CT VENOGRAM HEAD TECHNIQUE: Vena graphic images of the head were obtained following administration of intravenous contrast. CONTRAST:  75mL OMNIPAQUE IOHEXOL 300 MG/ML  SOLN COMPARISON:  Head CT 12/30/2018 FINDINGS: There is a large filling defect within the posterior superior sagittal sinus and within the straight sinus. The superior sagittal sinus has expanded in size since the earlier examination. Filling defect extends into both transverse sinuses and the left sigmoid sinus. Additionally, there central filling defects within the left internal jugular vein and left paravertebral vein (series 3, image 10). There is normal opacification of the anterior half of superior sagittal sinus. IMPRESSION: Confirmation of extensive venous thrombosis, involving the superior sagittal sinus, straight sinus, both transverse sinuses, the left sigmoid sinus, the left internal jugular vein and the left paravertebral vein. Critical Value/emergent results were called by telephone at the time of interpretation on 12/30/2018 at 10:41 pm to Dr. Linwood DibblesJON KNAPP , who verbally acknowledged these results. Electronically Signed   By: Deatra RobinsonKevin  Herman M.D.   On: 12/30/2018 22:41    PHYSICAL  EXAM  Pleasant mildly obese young Caucasian male not in distress. . Afebrile. Head is nontraumatic. Neck is supple without bruit.    Cardiac exam no murmur or gallop. Lungs are clear to auscultation. Distal pulses are well felt. Neurological Exam ;  Awake  Alert oriented x 3. Normal speech and language.eye movements full without nystagmus.fundi were not  visualized. Vision acuity and fields appear normal. Hearing is normal. Palatal movements are normal. Face symmetric. Tongue midline. Normal strength, tone, reflexes and coordination. Normal sensation. Gait deferred.   ASSESSMENT/PLAN Mr. BRAESON KETNER is a 23 y.o. male with history of head trauma 1 week ago presenting with HA. Found to have a venous sinus thrombosis. .   Traumatic Venous Sinus Thrombosis  CT head  No acute stroke. Increased density posterior superior saggital sinus, straight sinus, and R transverse sinus.  CT Venogram confirmed extensive venous thrombosis involving superior sagittal sinus, straight sinus, both transverse sinuses, L sigmoid sinus. Central filling defect L IJ and L paravertebral vein.  MRI  Dural venous sinus thrombosis. No infarct. No hemorrhage  IV heparin for VTE prophylaxis  No antithrombotic prior to admission, now on heparin IV. Recommend pradaxa for treatment if insurance will pay. Dr. Sidney Ace to ask CM to check coverage. If able to start today, ok for discharge tomorrow. If insurance does not cover pradaxa, recommend treatment with warfarin with lovenox bridge. Ok to d/c home with lovenox bridge. Have [facility] follow and manage warfarin.   Disposition:  Return home with parents x 1 week. Ok to return to school after 1 week if stable  Ok for follow up with Dr. Pearlean Brownie in [place] during breaks and/or with local [place], [place] neurologist during school  Other Stroke Risk Factors  Obesity, Body mass index is 32.55 kg/m., recommend weight loss, diet and exercise as appropriate   Hx Migraines  Frequent monster drink use, high caffeine intake - advised to stop use d/t stroke risk  Electronic cigarette use (Juul) advised to stop d/t risk of worsening thrombosis  Hospital day # 1  Annie Main, MSN, APRN, ANVP-BC, AGPCNP-BC Advanced Practice Stroke Nurse [facility] See Amion for Schedule & Pager information 12/31/2018 10:16 AM  I  have personally obtained history,examined this patient, reviewed notes, independently viewed imaging studies, participated in medical decision making and plan of care.ROS completed by me personally and pertinent positives fully documented  I have made any additions or clarifications directly to the above note. Agree with note above. He presented with subacute headache following mild head trauma with CT and MRI showing right transverse and posterior sagittal sinus thrombosis and will likely need anticoagulation for next 6-9 months. Patient is a Archivist at [employer] and following his warfarin and INR and following a strict diet may be very difficult for him. Recent study suggests new agent like Pradaxa is non-inferior to warfarin for cerebral venous sinus thrombosis hence I recommend Pradaxa if his insurance will cover it. I also advised the patient to maintain adequate hydration and drink at least 8 glasses of liquids today. He was asked to avoid high energy protein shakes that he drinks everyday. He may be discharged later once his switch to oral anticoagulation with Pradaxa eliquis and follow-up in the student health clinic at [facility]. He may follow-up with me when he returns home during his breaks or house for referral to a local neurologist in [facility]he will likely need follow-up CT venogram head 3-4 months and the decision  of discontinuing anticoagulation will have to be made at 6-9 months. Discussed with patient, his parents and Dr. Theadora Rama. Greater than 50% time during the study 5 minute visit was spent on counseling and coordination of care about his cerebral venous sinus thrombosis and discussion about need for anticoagulation and outpatient neurology follow-up and answered questions  Delia Heady, MD Medical Director [facility] Pager: [phone] 12/31/2018 2:20 PM  To contact Stroke Continuity provider, please refer to [email or web address]. After hours,  contact General Neurology

## 2018-12-31 NOTE — Care Management (Signed)
Case manager asked CMA to complete benefits check for Pradaxa. Please see her note for 12/31/18 12:52pm. Thanks.

## 2018-12-31 NOTE — Progress Notes (Signed)
ANTICOAGULATION CONSULT NOTE - Follow Up Consult  Pharmacy Consult for Heparin Indication: cerebral venous thrombosis  No Known Allergies  Patient Measurements: Height: 6' (182.9 cm) Weight: 240 lb (108.9 kg) IBW/kg (Calculated) : 77.6 Heparin Dosing Weight: 101 kg  Vital Signs: Temp: 97.3 F (36.3 C) (02/21 1049) Temp Source: Oral (02/21 1049) BP: 151/87 (02/21 1049) Pulse Rate: 55 (02/21 1049)  Labs: Recent Labs    12/30/18 2050 12/30/18 2308 12/31/18 0729 12/31/18 1626  HGB 15.5  --  14.5  --   HCT 46.3  --  42.5  --   PLT 226  --  196  --   APTT  --  32  --   --   LABPROT  --  13.9  --   --   INR  --  1.08  --   --   HEPARINUNFRC  --   --  0.23* 0.38  CREATININE 1.04  --  1.05  --     Estimated Creatinine Clearance: 141.8 mL/min (by C-G formula based on SCr of 1.05 mg/dL).   Medical History: History reviewed. No pertinent past medical history.  Medications:  See electronic med rec  Assessment: 23 y.o. presents with headache. Found to have extensive cerebral vein thrombosis on CT venogram. To continue heparin with no boluses and lower goal rate of 0.3-0.5 units/ml. No AC PTA. CBC stable.  Repeat HL is now therapeutic at 0.38. No bleeding noted per RN.  Goal of Therapy:  Heparin level 0.3-0.5 units/ml Monitor platelets by anticoagulation protocol: Yes   Plan:  Continue Heparin at 1400 units/hr (no bolus)  Will f/u 6 hr confirmatory heparin level Daily heparin level and CBC  Vinnie Level, PharmD., BCPS Clinical Pharmacist Clinical phone for 12/31/18 until 3:30pm: [phone] If after 3:30pm, please refer to [facility] for unit-specific pharmacist

## 2018-12-31 NOTE — Progress Notes (Signed)
ANTICOAGULATION CONSULT NOTE - Initial Consult  Pharmacy Consult for Heparin Indication: cerebral venous thrombosis  No Known Allergies  Patient Measurements: Height: 6' (182.9 cm) Weight: 240 lb (108.9 kg) IBW/kg (Calculated) : 77.6 Heparin Dosing Weight: 101 kg  Vital Signs: Temp: 98 F (36.7 C) (02/21 0003) Temp Source: Oral (02/21 0003) BP: 161/94 (02/21 0003) Pulse Rate: 65 (02/21 0003)  Labs: Recent Labs    12/30/18 2050 12/30/18 2308 12/31/18 0729  HGB 15.5  --  14.5  HCT 46.3  --  42.5  PLT 226  --  196  APTT  --  32  --   LABPROT  --  13.9  --   INR  --  1.08  --   HEPARINUNFRC  --   --  0.23*  CREATININE 1.04  --  1.05    Estimated Creatinine Clearance: 141.8 mL/min (by C-G formula based on SCr of 1.05 mg/dL).   Medical History: History reviewed. No pertinent past medical history.  Medications:  See electronic med rec  Assessment: 23 y.o. presents with headache. Found to have extensive cerebral vein thrombosis on CT venogram. To continue heparin with no boluses and lower goal rate of 0.3-0.5 units/ml. No AC PTA. CBC stable.  First heparin level 0.23 (lower than goal). No bleeding noted.   Goal of Therapy:  Heparin level 0.3-0.5 units/ml Monitor platelets by anticoagulation protocol: Yes   Plan:  Increase Heparin to 1400 units/hr (no bolus)  Will f/u 6 hr heparin level Daily heparin level and CBC  Ronnel Zuercher A. Jeanella Craze, PharmD, BCPS Clinical Pharmacist [place] Please utilize Amion for appropriate phone number to reach the unit pharmacist [facility])   12/31/2018,8:30 AM

## 2018-12-31 NOTE — Progress Notes (Signed)
PROGRESS NOTE   Leonard DuckingSeth A Crawford  YQM:[id number]    DOB: 06/24/1996    DOA: 12/30/2018  PCP: Patient, No Pcp Per   I have briefly reviewed patients [name].  Brief Narrative:  23 year old [employer] [place], struck the back of his head to his friend's knee while they were wrestling in the yard approximately 2 weekends PTA, no associated LOC, followed few days later by intermittent headache which progressively worsened 2 to 3 days PTA with associated blurred vision, nausea, vomiting and inability to keep oral intake and presented to ED.  Evaluation in the hospital confirmed traumatic venous sinus thrombosis.  IV heparin initiated.  Neurology consulted.   Assessment & Plan:   Principal Problem:   Intracranial venous thrombosis Active Problems:   DVT (deep venous thrombosis) (HCC)   Elevated blood pressure reading   Extensive dural venous sinus thrombosis - Possibly precipitated by head injury sustained while playing with his friend a week or 2 prior to admission. - No prior history of VTE.  History of provoked lower extremity DVT in maternal aunt following lower extremity fracture in cast.  Patient reports using energy drinks (Monster drinks) but denies hormonal supplements or other herbal products. - CT head: Increased density within the posterior superior sagittal sinus, straight sinus and right transverse sinus.  Findings may represent dural venous sinus thrombosis. - CTV head: Confirmation of extensive venous thrombosis, involving the superior sagittal sinus, straight sinus, both transverse sinuses, the left sigmoid sinus, the left internal jugular vein and the left paravertebral vein. - MRI brain: Extensive dural venous sinus thrombosis as established on CTV.  No associated infarct or intracranial hemorrhage.  No progressive thrombosis. - Hypercoagulable panel: Antithrombin III activity normal.  Rest pending.  INR 1.08. -  Neurology consulted and follow-up appreciated.  I discussed with [name]. - Currently on IV heparin.  Neurology recommends Pradaxa but his insurance does not cover.  Thereby considering Lovenox bridge with Coumadin anticoagulation at discharge.  [name] plans to discuss with patient and parents regarding option of Eliquis or Xarelto (non-label use). -Continue IV fluid hydration, pain control, encourage oral intake and Ensure that he is able to tolerate orally prior to possible discharge in the next 24 hours. -Discussed with patient's father to see if parents can arrange follow-up with their PCP next week and subsequently with the PCP in patient's college town.  Case management consulted for Lovenox bridging and PCP follow-up as well -As per neurology, patient may return to college in 1 week if stable, may follow-up with local neurologist in [place] or with [name] in [place]  Nausea and vomiting -Likely precipitated by dural sinus venous thrombosis.  IV fluid hydration and pain management.  Oral intake as tolerated.  History of migraines  Obesity/Body mass index is 32.55 kg/m.   Elevated blood pressure Likely related to principal problem above.  Monitor for now.     DVT prophylaxis: Currently on IV heparin infusion Code Status: Full Family Communication: Discussed in detail with patient's parents at bedside along with [name] Disposition: DC home pending clinical improvement, possibly in the next 24 hours.   Consultants:  Neurology  Procedures:  None  Antimicrobials:  None   Subjective: Patient interviewed and examined this morning along with [name], patient's RN and parents at bedside.  Headache controlled with pain medications.  No visual symptoms reported.  Nausea and vomiting better.  Rest as above.  ROS:  As above, otherwise negative.  Objective:  Vitals:   12/30/18 2319 12/30/18 2320 12/31/18 0003 12/31/18 1049  BP: (!) 159/82  (!) 161/94 (!) 151/87    Pulse:  63 65 (!) 55  Resp:   20 18  Temp:   98 F (36.7 C) (!) 97.3 F (36.3 C)  TempSrc:   Oral Oral  SpO2:  98% 95% 98%  Weight:      Height:        Examination:  General exam: Pleasant young male, moderately built and obese, lying comfortably propped up in bed.  Oral mucosa with borderline hydration. Respiratory system: Clear to auscultation. Respiratory effort normal. Cardiovascular system: S1 & S2 heard, RRR. No JVD, murmurs, rubs, gallops or clicks. No pedal edema.  Telemetry personally reviewed: Sinus bradycardia mostly in the 50s, occasionally in the 40s. Gastrointestinal system: Abdomen is nondistended, soft and nontender. No organomegaly or masses felt. Normal bowel sounds heard. Central nervous system: Alert and oriented. No focal neurological deficits. Extremities: Symmetric 5 x 5 power. Skin: No rashes, lesions or ulcers Psychiatry: Judgement and insight appear normal. Mood & affect appropriate.     Data Reviewed: I have personally reviewed following labs and imaging studies  CBC: Recent Labs  Lab 12/30/18 2050 12/31/18 0729  WBC 9.5 6.5  HGB 15.5 14.5  HCT 46.3 42.5  MCV 85.1 84.3  PLT 226 196   Basic Metabolic Panel: Recent Labs  Lab 12/30/18 2050 12/31/18 0729  NA 140 140  K 3.7 4.0  CL 105 104  CO2 26 27  GLUCOSE 99 124*  BUN 9 12  CREATININE 1.04 1.05  CALCIUM 9.7 9.4   Liver Function Tests: No results for input(s): AST, ALT, ALKPHOS, BILITOT, PROT, ALBUMIN in the last 168 hours. Coagulation Profile: Recent Labs  Lab 12/30/18 2308  INR 1.08         Radiology Studies: Ct Head Wo Contrast  Result Date: 12/30/2018 CLINICAL DATA:  23 y/o  M; severe persistent headache. EXAM: CT HEAD WITHOUT CONTRAST TECHNIQUE: Contiguous axial images were obtained from the base of the skull through the vertex without intravenous contrast. COMPARISON:  None. FINDINGS: Brain: No evidence of acute infarction, hemorrhage, hydrocephalus, extra-axial  collection or mass lesion/mass effect. Vascular: Asymmetric increased density within the posterior superior sagittal sinus, straight sinus, and right transverse sinus. Skull: Normal. Negative for fracture or focal lesion. Sinuses/Orbits: No acute finding. Other: None. IMPRESSION: 1. No evidence of acute intracranial abnormality. 2. Increased density within the posterior superior sagittal sinus, straight sinus, and right transverse sinus. Findings may represent dural venous sinus thrombosis. Head CTV or MRV is recommended. These results were called by telephone at the time of interpretation on 12/30/2018 at 8:27 pm to Dr. Linwood Dibbles , who verbally acknowledged these results. Electronically Signed   By: Mitzi Hansen M.D.   On: 12/30/2018 20:28   Mr Laqueta Jean EV Contrast  Result Date: 12/31/2018 CLINICAL DATA:  Headache EXAM: MRI HEAD WITHOUT AND WITH CONTRAST TECHNIQUE: Multiplanar, multiecho pulse sequences of the brain and surrounding structures were obtained without and with intravenous contrast. CONTRAST:  10 cc Gadavist intravenous COMPARISON:  None. FINDINGS: Brain: Extensive dural venous sinus thrombosis seen in the poster superior sagittal sinus, straight sinus, and bilateral transverse sinuses. Clot is also seen within left occipital emissary vein and in the upper left internal jugular vein. There is associated dural thickening that is smooth and considered secondary. No infarct, brain edema, or hemorrhage. No mass, white matter disease, or hydrocephalus.  Vascular: As above Skull and upper cervical spine: Negative for marrow lesion Sinuses/Orbits: Negative IMPRESSION: Extensive dural venous sinus thrombosis as established on CTV yesterday. No associated infarct or intracranial hemorrhage. No progressive thrombosis. Electronically Signed   By: Marnee Spring M.D.   On: 12/31/2018 10:19   Ct Venogram Head  Result Date: 12/30/2018 CLINICAL DATA:  Headache with blurry vision. Migraines. Recent  head trauma. EXAM: CT VENOGRAM HEAD TECHNIQUE: Vena graphic images of the head were obtained following administration of intravenous contrast. CONTRAST:  5mL OMNIPAQUE IOHEXOL 300 MG/ML  SOLN COMPARISON:  Head CT 12/30/2018 FINDINGS: There is a large filling defect within the posterior superior sagittal sinus and within the straight sinus. The superior sagittal sinus has expanded in size since the earlier examination. Filling defect extends into both transverse sinuses and the left sigmoid sinus. Additionally, there central filling defects within the left internal jugular vein and left paravertebral vein (series 3, image 10). There is normal opacification of the anterior half of superior sagittal sinus. IMPRESSION: Confirmation of extensive venous thrombosis, involving the superior sagittal sinus, straight sinus, both transverse sinuses, the left sigmoid sinus, the left internal jugular vein and the left paravertebral vein. Critical Value/emergent results were called by telephone at the time of interpretation on 12/30/2018 at 10:41 pm to Dr. Linwood Dibbles , who verbally acknowledged these results. Electronically Signed   By: Deatra Robinson M.D.   On: 12/30/2018 22:41        Scheduled Meds: . [START ON 01/03/2019] fluconazole  200 mg Oral Q Mon   Continuous Infusions: . sodium chloride 100 mL/hr at 12/31/18 0031  . heparin 1,400 Units/hr (12/31/18 1026)     LOS: 1 day     Marcellus Scott, MD, FACP, [facility]. [facility]  To contact the attending provider between 7A-7P or the covering provider during after hours 7P-7A, please log into the web site [email or web address] and access using universal [place] password for that web site. If you do not have the password, please call the hospital operator.  12/31/2018, 2:45 PM

## 2018-12-31 NOTE — Progress Notes (Signed)
ANTICOAGULATION CONSULT NOTE - Follow Up Consult  Pharmacy Consult for Heparin Indication: cerebral venous thrombosis  No Known Allergies  Patient Measurements: Height: 6' (182.9 cm) Weight: 236 lb 12.4 oz (107.4 kg) IBW/kg (Calculated) : 77.6 Heparin Dosing Weight: 101 kg  Vital Signs: Temp: 98.3 F (36.8 C) (02/21 1956) Temp Source: Oral (02/21 1956) BP: 146/85 (02/21 1956) Pulse Rate: 60 (02/21 1956)  Labs: Recent Labs    12/30/18 2050 12/30/18 2308 12/31/18 0729 12/31/18 1626 12/31/18 2214  HGB 15.5  --  14.5  --   --   HCT 46.3  --  42.5  --   --   PLT 226  --  196  --   --   APTT  --  32  --   --   --   LABPROT  --  13.9  --   --   --   INR  --  1.08  --   --   --   HEPARINUNFRC  --   --  0.23* 0.38 0.36  CREATININE 1.04  --  1.05  --   --     Estimated Creatinine Clearance: 140.9 mL/min (by C-G formula based on SCr of 1.05 mg/dL).   Medical History: History reviewed. No pertinent past medical history.  Medications:  See electronic med rec  Assessment: 23 y.o. presents with headache. Found to have extensive cerebral vein thrombosis on CT venogram. To continue heparin with no boluses and lower goal rate of 0.3-0.5 units/ml. No AC PTA. CBC stable.  Repeat HL is therapeutic  Goal of Therapy:  Heparin level 0.3-0.5 units/ml Monitor platelets by anticoagulation protocol: Yes   Plan:  Continue Heparin at 1400 units/hr (no bolus)  Daily heparin level and CBC  Thanks for allowing pharmacy to be a part of this patient's care.  Talbert Cage, PharmD Clinical Pharmacist

## 2019-01-01 LAB — HEPARIN LEVEL (UNFRACTIONATED): Heparin Unfractionated: 0.35 IU/mL (ref 0.30–0.70)

## 2019-01-01 LAB — CBC
HCT: 43.1 % (ref 39.0–52.0)
Hemoglobin: 14.5 g/dL (ref 13.0–17.0)
MCH: 28.7 pg (ref 26.0–34.0)
MCHC: 33.6 g/dL (ref 30.0–36.0)
MCV: 85.3 fL (ref 80.0–100.0)
Platelets: 182 10*3/uL (ref 150–400)
RBC: 5.05 MIL/uL (ref 4.22–5.81)
RDW: 12.6 % (ref 11.5–15.5)
WBC: 7.5 10*3/uL (ref 4.0–10.5)
nRBC: 0 % (ref 0.0–0.2)

## 2019-01-01 LAB — LUPUS ANTICOAGULANT PANEL
DRVVT: 49.5 s — ABNORMAL HIGH (ref 0.0–47.0)
PTT Lupus Anticoagulant: 37.1 s (ref 0.0–51.9)

## 2019-01-01 LAB — EXTRACTABLE NUCLEAR ANTIGEN ANTIBODY
ENA SM Ab Ser-aCnc: <0.2 AI (ref 0.0–0.9)
Ribonucleic Protein: 0.3 AI (ref 0.0–0.9)
SSA (Ro) (ENA) Antibody, IgG: <0.2 AI (ref 0.0–0.9)
SSB (La) (ENA) Antibody, IgG: <0.2 AI (ref 0.0–0.9)
Scleroderma (Scl-70) (ENA) Antibody, IgG: <0.2 AI (ref 0.0–0.9)
ds DNA Ab: <1 IU/mL (ref 0–9)

## 2019-01-01 LAB — CARDIOLIPIN ANTIBODIES, IGG, IGM, IGA
Anticardiolipin IgA: <9 APL U/mL (ref 0–11)
Anticardiolipin IgG: <9 GPL U/mL (ref 0–14)
Anticardiolipin IgM: <9 MPL U/mL (ref 0–12)

## 2019-01-01 LAB — PROTEIN S ACTIVITY: Protein S Activity: 119 % (ref 63–140)

## 2019-01-01 LAB — HOMOCYSTEINE: Homocysteine: 15.6 umol/L — ABNORMAL HIGH (ref 0.0–14.5)

## 2019-01-01 LAB — PROTEIN S, TOTAL: Protein S Ag, Total: 112 % (ref 60–150)

## 2019-01-01 LAB — DRVVT MIX: dRVVT Mix: 43.4 s (ref 0.0–47.0)

## 2019-01-01 LAB — PROTEIN C, TOTAL: Protein C, Total: 111 % (ref 60–150)

## 2019-01-01 LAB — ANTI-JO 1 ANTIBODY, IGG: Anti JO-1: <0.2 AI (ref 0.0–0.9)

## 2019-01-01 LAB — PROTEIN C ACTIVITY: Protein C Activity: 106 % (ref 73–180)

## 2019-01-01 MED ORDER — APIXABAN 5 MG PO TABS: 5.0000 mg | ORAL_TABLET | Freq: Two times a day (BID) | ORAL | Status: DC

## 2019-01-01 MED ORDER — ELIQUIS 5 MG VTE STARTER PACK: ORAL_TABLET | ORAL | 0 refills | Status: DC

## 2019-01-01 MED ORDER — APIXABAN 5 MG PO TABS
10.0000 mg | ORAL_TABLET | Freq: Two times a day (BID) | ORAL | Status: DC
  Administered 2019-01-01: 10 mg via ORAL
  Filled 2019-01-01: qty 2

## 2019-01-01 MED ORDER — TRAMADOL HCL 50 MG PO TABS: 50.0000 mg | ORAL_TABLET | Freq: Three times a day (TID) | ORAL | 0 refills | Status: AC | PRN

## 2019-01-01 MED ORDER — ACETAMINOPHEN 325 MG PO TABS: 650.0000 mg | ORAL_TABLET | Freq: Four times a day (QID) | ORAL | Status: AC | PRN

## 2019-01-01 NOTE — Care Management (Signed)
Patient and parents provided with 30 day and $10 copay Eliquis cards

## 2019-01-01 NOTE — Discharge Summary (Signed)
Physician Discharge Summary  Leonard Crawford:[id number] DOB: Mar 12, 1996  PCP: Leonard Crawford, No Pcp Per  Admit date: 12/30/2018 Discharge date: 01/01/2019  Recommendations for Outpatient Follow-up:  1. [facility] [place]/new PCP on 01/12/2019 at 1:40 PM.  Please follow-up outstanding hypercoagulable panel that was sent from the hospital. 2. [facility] in 4 weeks.  Office will call to schedule appointment. 3. Leonard Crawford and his parents have also been advised to try to arrange for a PCP and Neurologist to follow-up in [place], [place] while he is in college.  They verbalized understanding.  Home Health: None Equipment/Devices: None  Discharge Condition: Improved and stable CODE STATUS: Full Diet recommendation: Regular diet.  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  Evaluation in the hospital confirmed traumatic venous sinus thrombosis.  IV heparin initiated.  Neurology consulted.   Assessment & Plan:  Extensive dural venous sinus thrombosis - Possibly precipitated by mild head injury sustained while playing with his friend a week or 2 prior to admission. - No prior history of VTE.  History of provoked lower extremity DVT in maternal aunt following lower extremity fracture in cast.  Leonard Crawford reports using energy drinks (Monster drinks) but denies hormonal supplements or other herbal products. - CT head: Increased  density within the posterior superior sagittal sinus, straight sinus and right transverse sinus.  Findings may represent dural venous sinus thrombosis. - CTV head: Confirmation of extensive venous thrombosis, involving the superior sagittal sinus, straight sinus, both transverse sinuses, the left sigmoid sinus, the left internal jugular vein and the left paravertebral vein. - MRI brain: Extensive dural venous sinus thrombosis as established on CTV.  No associated infarct or intracranial hemorrhage.  No progressive thrombosis. - Hypercoagulable panel: Antithrombin III activity normal.  Rest pending.  INR 1.08. - Neurology consulted and follow-up appreciated.  I discussed with Dr. Pearlean Brownie. - He was initially treated with IV heparin and completed >24 hours on same.  He was also treated with IV fluids and pain management.  Dr. Pearlean Brownie recommended 6 to 9 months anticoagulation.  Pradaxa versus Coumadin would have been first choices.  Pradaxa unfortunately is not covered by his medical insurance.  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 Thereby Dr. Pearlean Brownie recommended Apixaban anticoagulation at discharge which he personally discussed with Leonard Crawford and parents yesterday and they were in agreement.  Apixaban is covered by his medical insurance.  Leonard Crawford was advised to maintain adequate hydration and avoid high-energy protein shakes that he was drinking daily PTA.  He and his parents were given option of following up with student health clinic at [facility] and he may follow-up with Dr. Pearlean Brownie when he returns home during his breaks and are with local [name] [place] neurologist during school.  Parents expressed interest in following up with physicians in [place].  He will likely need follow-up CTV head in 3 to 4 months and the  decision of discontinuing anticoagulation will have to be made at 6 to 9 months.  Leonard Crawford will return home  with parents to recuperate for the next 7 to 10 days prior to returning to school. -Clinically improved.  Headache has resolved and now has intermittent pressure-like sensation over his head.  Headache controlled with tramadol.  No visual abnormalities.  Tolerating diet without nausea or vomiting.  Able to ambulate and shower. -IV heparin was discontinued.  He received his first dose of apixaban in the hospital.  Case management assisted him with apixaban discount card.  Hypercoagulable work-up: HIV antibody screen, ENA anti-Jo 1, anti-thrombin 3, protein C total and activity, protein S total and activity, anticardiolipin antibodies: Negative.  No lupus anticoagulant was detected.  Homocysteine: 15.6, mildly elevated (reference range 0-14.5)  Pending labs: Beta-2 glycoprotein, factor V Leyden and prothrombin gene mutation.  Nausea and vomiting -Likely precipitated by dural sinus venous thrombosis.  IV fluid hydration and pain management.    Resolved.  Tolerating diet.  History of migraines Current cause of headache was not migraine but dural venous sinus thrombosis.  NSAIDs discontinued to avoid bleeding complications while on anticoagulation.  Obesity/Body mass index is 32.55 kg/m.   Elevated blood pressure Likely related to principal problem above.  Monitor for now.  Close outpatient follow-up.  Asymptomatic sinus bradycardia -Most likely physiological.  Leonard Crawford is young and physically active.  He plays sports and exercises regularly.  Telemetry showed sinus bradycardia mostly in the 50s, at times in the 40s and occasionally in the high 30s and 1 or 2 pauses up to 2.8 seconds.  He was asymptomatic.  I reviewed telemetry with an EP Cardiologist who agrees that this is physiological and no intervention indicated.  Discussed with Leonard Crawford and parents at bedside.   Consultants:  Neurology  Procedures:  None  Discharge Instructions  Discharge Instructions    Ambulatory referral  to Neurology   Complete by:  As directed    Follow up with stroke clinic NP (Jessica Vanschaick or Darrol Angel, if both not available, consider Dr. Delia Heady, Dr. Jamelle Rushing, or Dr. Naomie Dean) at [facility] in about 4 weeks.   Call MD for:  difficulty breathing, headache or visual disturbances   Complete by:  As directed    Call MD for:  extreme fatigue   Complete by:  As directed    Call MD for:  persistant dizziness or light-headedness   Complete by:  As directed    Call MD for:  persistant nausea and vomiting   Complete by:  As directed    Call MD for:  severe uncontrolled pain   Complete by:  As directed    Call MD for:  temperature >100.4   Complete by:  As directed    Diet general   Complete by:  As directed    Increase activity slowly   Complete by:  As directed        Medication List    STOP taking these medications   aspirin-acetaminophen-caffeine 250-250-65 MG tablet Commonly known as:  EXCEDRIN MIGRAINE   ibuprofen 200 MG tablet Commonly known as:  ADVIL,MOTRIN     TAKE these medications   acetaminophen 325 MG tablet Commonly known as:  TYLENOL Take 2 tablets (650 mg total) by mouth every 6 (six) hours as needed for mild pain or headache (or Fever >/= 101).   ELIQUIS DVT/PE STARTER PACK 5 MG Tabs Take as directed on package: start with two-5mg  tablets twice daily  for 7 days. On day 8, switch to one-5mg  tablet twice daily. Pharmacy: Please discard first dose which he received in the hospital.   fluconazole 200 MG tablet Commonly known as:  DIFLUCAN Take 1 tablet (200 mg total) by mouth once a week. What changed:  when to take this   traMADol 50 MG tablet Commonly known as:  ULTRAM Take 1 tablet (50 mg total) by mouth every 8 (eight) hours as needed for moderate pain or severe pain.      Follow-up Information    [facility] Follow up in 4 week(s).   Specialty:  Neurology Why:  office will call to  schedule appt date and time. or you can follow up with neurologist in [place]. your choice. either way is fine. Contact information: [address] [place] [address] [phone]       [facility]. Go to.   Specialty:  Family Medicine Why:  Your appointment is scheduled for Wednesday, January 12, 2019 at 1:40pm, You will need to arrive by 1:20pm please.  Contact information: [address] [place] [address] [phone]          No Known Allergies    Procedures/Studies: Ct Head Wo Contrast  Result Date: 12/30/2018 CLINICAL DATA:  23 y/o  M; severe persistent headache. EXAM: CT HEAD WITHOUT CONTRAST TECHNIQUE: Contiguous axial images were obtained from the base of the skull through the vertex without intravenous contrast. COMPARISON:  None. FINDINGS: Brain: No evidence of acute infarction, hemorrhage, hydrocephalus, extra-axial collection or mass lesion/mass effect. Vascular: Asymmetric increased density within the posterior superior sagittal sinus, straight sinus, and right transverse sinus. Skull: Normal. Negative for fracture or focal lesion. Sinuses/Orbits: No acute finding. Other: None. IMPRESSION: 1. No evidence of acute intracranial abnormality. 2. Increased density within the posterior superior sagittal sinus, straight sinus, and right transverse sinus. Findings may represent dural venous sinus thrombosis. Head CTV or MRV is recommended. These results were called by telephone at the time of interpretation on 12/30/2018 at 8:27 pm to Dr. Linwood Dibbles , who verbally acknowledged these results. Electronically Signed   By: Mitzi Hansen M.D.   On: 12/30/2018 20:28   Mr Laqueta Jean YO Contrast  Result Date: 12/31/2018 CLINICAL DATA:  Headache EXAM: MRI HEAD WITHOUT AND WITH CONTRAST TECHNIQUE: Multiplanar, multiecho pulse sequences of the brain and surrounding structures were obtained without and with  intravenous contrast. CONTRAST:  10 cc Gadavist intravenous COMPARISON:  None. FINDINGS: Brain: Extensive dural venous sinus thrombosis seen in the poster superior sagittal sinus, straight sinus, and bilateral transverse sinuses. Clot is also seen within left occipital emissary vein and in the upper left internal jugular vein. There is associated dural thickening that is smooth and considered secondary. No infarct, brain edema, or hemorrhage. No mass, white matter disease, or hydrocephalus. Vascular: As above Skull and upper cervical spine: Negative for marrow lesion Sinuses/Orbits: Negative IMPRESSION: Extensive dural venous sinus thrombosis as established on CTV yesterday. No associated infarct or intracranial hemorrhage. No progressive thrombosis. Electronically Signed   By: Marnee Spring M.D.   On: 12/31/2018 10:19   Ct Venogram Head  Result Date: 12/30/2018 CLINICAL DATA:  Headache with blurry vision. Migraines. Recent head trauma. EXAM: CT VENOGRAM HEAD TECHNIQUE: Vena graphic images of the head were obtained following administration of intravenous contrast. CONTRAST:  37mL OMNIPAQUE IOHEXOL 300 MG/ML  SOLN COMPARISON:  Head CT 12/30/2018 FINDINGS: There is a large filling defect within the posterior superior  sagittal sinus and within the straight sinus. The superior sagittal sinus has expanded in size since the earlier examination. Filling defect extends into both transverse sinuses and the left sigmoid sinus. Additionally, there central filling defects within the left internal jugular vein and left paravertebral vein (series 3, image 10). There is normal opacification of the anterior half of superior sagittal sinus. IMPRESSION: Confirmation of extensive venous thrombosis, involving the superior sagittal sinus, straight sinus, both transverse sinuses, the left sigmoid sinus, the left internal jugular vein and the left paravertebral vein. Critical Value/emergent results were called by telephone at the  time of interpretation on 12/30/2018 at 10:41 pm to Dr. Linwood Dibbles , who verbally acknowledged these results. Electronically Signed   By: Deatra Robinson M.D.   On: 12/30/2018 22:41      Subjective: Leonard Crawford interviewed and examined this morning along with parents at bedside.  Feels much better.  Eager to go home.  No further headaches or visual symptoms.  Denies dizziness or lightheadedness.  Has intermittent pressure-like sensation from neck to vertex at times.  Had mild episode of nausea and vomiting but since then resolved and tolerated diet.  Wanted to shower badly.  No bleeding reported.  Discharge Exam:  Vitals:   12/31/18 1812 12/31/18 1956 01/01/19 0431 01/01/19 0925  BP: (!) 155/95 (!) 146/85 137/85 (!) 143/95  Pulse: 67 60 (!) 47 (!) 52  Resp: Temp: 97.9 F (36.6 C) 98.3 F (36.8 C) 97.6 F (36.4 C) (!) 97.3 F (36.3 C)  TempSrc: Oral Oral Oral Oral  SpO2: 98% 96% 99% 98%  Weight:  107.4 kg    Height:       General exam: Pleasant young male, moderately built and obese, sitting up comfortably in chair.  Did not appear in any distress.  Oral mucosa moist. Respiratory system: Clear to auscultation. Respiratory effort normal. Cardiovascular system: S1 & S2 heard, RRR. No JVD, murmurs, rubs, gallops or clicks. No pedal edema.    Telemetry personally reviewed: Mostly sinus bradycardia in the 50s-SR.  At times sinus bradycardia in the 40s, occasionally in the high 30s.  1 or 2 sinus pauses up to 2.8 seconds, asymptomatic. Gastrointestinal system: Abdomen is nondistended, soft and nontender. No organomegaly or masses felt. Normal bowel sounds heard. Central nervous system: Alert and oriented. No focal neurological deficits. Extremities: Symmetric 5 x 5 power. Skin: No rashes, lesions or ulcers Psychiatry: Judgement and insight appear normal. Mood & affect appropriate.    The results of significant diagnostics from this hospitalization (including imaging, microbiology,  ancillary and laboratory) are listed below for reference.      Labs: CBC: Recent Labs  Lab 12/30/18 2050 12/31/18 0729 01/01/19 0357  WBC 9.5 6.5 7.5  HGB 15.5 14.5 14.5  HCT 46.3 42.5 43.1  MCV 85.1 84.3 85.3  PLT 226 196 182   Basic Metabolic Panel: Recent Labs  Lab 12/30/18 2050 12/31/18 0729  NA 140 140  K 3.7 4.0  CL 105 104  CO2 26 27  GLUCOSE 99 124*  BUN 9 12  CREATININE 1.04 1.05  CALCIUM 9.7 9.4   Liver Function Tests: Recent Labs  Lab 12/31/18 1626  AST 21  ALT 30  ALKPHOS 74  BILITOT 0.4  PROT 7.0  ALBUMIN 3.9   I discussed in detail with Leonard Crawford's parents at bedside, updated care and answered questions.  Time coordinating discharge: 40 minutes  SIGNED:  Marcellus Scott, MD, FACP, [facility]. [facility]  To contact  the attending provider between 7A-7P or the covering provider during after hours 7P-7A, please log into the web site [email or web address] and access using universal [place] password for that web site. If you do not have the password, please call the hospital operator.

## 2019-01-01 NOTE — Progress Notes (Addendum)
ANTICOAGULATION CONSULT NOTE - Follow Up Consult  Pharmacy Consult for Heparin Indication: cerebral venous thrombosis  No Known Allergies  Patient Measurements: Height: 6' (182.9 cm) Weight: 236 lb 12.4 oz (107.4 kg) IBW/kg (Calculated) : 77.6 Heparin Dosing Weight: 101 kg  Vital Signs: Temp: 97.6 F (36.4 C) (02/22 0431) Temp Source: Oral (02/22 0431) BP: 137/85 (02/22 0431) Pulse Rate: 47 (02/22 0431)  Labs: Recent Labs    12/30/18 2050 12/30/18 2308  12/31/18 0729 12/31/18 1626 12/31/18 2214 01/01/19 0357  HGB 15.5  --   --  14.5  --   --  14.5  HCT 46.3  --   --  42.5  --   --  43.1  PLT 226  --   --  196  --   --  182  APTT  --  32  --   --   --   --   --   LABPROT  --  13.9  --   --   --   --   --   INR  --  1.08  --   --   --   --   --   HEPARINUNFRC  --   --    < > 0.23* 0.38 0.36 0.35  CREATININE 1.04  --   --  1.05  --   --   --    < > = values in this interval not displayed.    Estimated Creatinine Clearance: 140.9 mL/min (by C-G formula based on SCr of 1.05 mg/dL).   Medical History: History reviewed. No pertinent past medical history.   Assessment: 23 y.o. presents with headache. Found to have extensive cerebral vein thrombosis on CT venogram. To continue heparin with no boluses and lower goal rate of 0.3-0.5 units/ml. No AC PTA. CBC stable.  2/22 AM: Heparin level is therapeutic this morning at 0.35 at 1400 units/hr. No bleeding noted. CBC this morning stable and WNL.   Goal of Therapy:  Heparin level 0.3-0.5 units/ml Monitor platelets by anticoagulation protocol: Yes   Plan:  Continue Heparin at 1400 units/hr  Daily heparin level and CBC F/u transition to oral anticoag  Thanks for allowing pharmacy to be a part of this patient's care.  Wendelyn Breslow, PharmD PGY1 Pharmacy Resident Phone: [phone] 01/01/2019 7:06 AM   _________________  ADDENDUM:  Per Dr. Waymon Amato, will DC heparin and start apixaban for cerebral vein thrombosis.  After discussion with Dr. Waymon Amato and Neurology, will dose in the same manner as DVT for this off-label indication.  Plan:  DC Heparin Apixaban 10mg  BID x7 days followed by 5mg  BID  Thank you for involving pharmacy in this patient's care.  Wendelyn Breslow, PharmD PGY1 Pharmacy Resident Phone: [phone] 01/01/2019 9:58 AM

## 2019-01-03 LAB — BETA-2-GLYCOPROTEIN I ABS, IGG/M/A
Beta-2 Glyco I IgG: <9 GPI IgG units (ref 0–20)
Beta-2-Glycoprotein I IgA: <9 GPI IgA units (ref 0–25)
Beta-2-Glycoprotein I IgM: <9 GPI IgM units (ref 0–32)

## 2019-01-05 LAB — FACTOR 5 LEIDEN

## 2019-01-06 LAB — PROTHROMBIN GENE MUTATION

## 2019-02-14 ENCOUNTER — Other Ambulatory Visit: Payer: Self-pay

## 2019-02-14 ENCOUNTER — Encounter: Payer: Self-pay | Admitting: Adult Health

## 2019-02-14 ENCOUNTER — Ambulatory Visit (INDEPENDENT_AMBULATORY_CARE_PROVIDER_SITE_OTHER): Payer: BLUE CROSS/BLUE SHIELD | Admitting: Adult Health

## 2019-02-14 VITALS — BP 130/94 | HR 70

## 2019-02-14 DIAGNOSIS — G08 Intracranial and intraspinal phlebitis and thrombophlebitis: Secondary | ICD-10-CM

## 2019-02-14 NOTE — Progress Notes (Signed)
[facility] [address] [place]. [place] [address] [phone]       VIRTUAL VISIT FOLLOW UP NOTE  Mr. Leonard Crawford Date of Birth:  06/16/1996 Medical Record Number:  [id number]   Reason for Referral:  hospital stroke follow up    Virtual Visit via Video Note  I connected with Jacqulyn Ducking on 02/14/19 at 10:15 AM EDT by a video enabled telemedicine application located remotely from office within my own home and verified that I am speaking with the correct person using two identifiers who was located at their own home and accompanied by his mother.  I discussed the limitations of evaluation and management by telemedicine and the availability of in person appointments. The patient expressed understanding and agreed to proceed.   CHIEF COMPLAINT:  Chief Complaint  Patient presents with  . 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 History obtained from patient and chart review. Reviewed all radiology images and labs personally.  Mr. Leonard Crawford is a 23 y.o. male with underlying medical history of obesity and migraines who presented to [facility] ED with headache after head trauma 1 week prior.  He apparently experienced head trauma during "horseplay" where he hit his head on his friend's knee with reported decreased p.o. intake since that time with dehydration.  CT head reviewed and was negative for acute infarct but did show increased density posterior superior sagittal sinus, straight sinus and right transverse sinus.  CT venogram confirmed extensive venous thrombosis involving superior sagittal sinus, straight sinus, both transverse sinuses, left sigmoid sinus and central filling defect left IJ  and left paravertebral vein.  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  Recommended initiating Pradaxa with recommendations of continuing for 6 to 9 months as initiating warfarin with frequent INR level monitoring and strict diet may be difficult for him as he is a Archivist at [employer].  Other stroke risk factors include obesity, history of migraines, frequent monitor drink use with high caffeine intake with recommendations of discontinuing along with electronic cigarette use with recommendation discontinuing due to risk of worsening thrombosis.  Recommended follow-up CT venogram in 3 to 4 months duration and decision of discontinuing anticoagulation will be made at 6 to 9 months.  Pradaxa was not covered by his medical insurance therefore recommended initiation of apixaban.  Hypercoagulable work-up performed which were all negative.  He was discharged home in stable condition with resolution of headache.  He has been stable since returning home without recurring headache or stroke/TIA symptoms.  He has continued on Eliquis without side effects of bleeding or bruising.  He is currently home from school due to COVID-19 pandemic. He has continued doing online classes without difficulty. He has not been monitoring BP at home but mother does have cuff which she will start to monitor. They did check BP during visit which was at 130/94.  Discussion regarding pending genetic lab results at hospital discharge which included prothrombin gene mutation which was negative.  He denies any ongoing use of caffeine drinks, electronic cigarette use or protein shakes.  He endorses use of adequate fluid intake and nutrition.  No further concerns at this time.  ROS:   14 system review of systems performed and negative with exception of seasonal allergies  PMH:  Past Medical History:  Diagnosis Date  . Stroke [employer])     PSH:  Past Surgical  History:  Procedure Laterality Date  . SHOULDER SURGERY      Social History:  Social History   Socioeconomic History  . Marital status: Single    Spouse name: Not on file  . Number of children: Not on file  . Years of education: Not on file  . Highest education level: Not on file  Occupational History  . Not on file  Social Needs  . Financial resource strain: Not on file  . Food insecurity:    Worry: Not on file    Inability: Not on file  . Transportation needs:    Medical: Not on file    Non-medical: Not on file  Tobacco Use  . Smoking status: Never Smoker  . Smokeless tobacco: Never Used  Substance and Sexual Activity  . Alcohol use: Not Currently  . Drug use: Not Currently  . Sexual activity: Not on file  Lifestyle  . Physical activity:    Days per week: Not on file    Minutes per session: Not on file  . Stress: Not on file  Relationships  . Social connections:    Talks on phone: Not on file    Gets together: Not on file    Attends religious service: Not on file    Active member of club or organization: Not on file    Attends meetings of clubs or organizations: Not on file    Relationship status: Not on file  . Intimate partner violence:    Fear of current or ex partner: Not on file    Emotionally abused: Not on file    Physically abused: Not on file    Forced sexual activity: Not on file  Other Topics Concern  . Not on file  Social History Narrative  . Not on file    Family History:  Family History  Problem Relation Age of Onset  . CAD Maternal Grandfather   . Stroke Paternal Grandfather     Medications:   Current Outpatient Medications on File Prior to Visit  Medication Sig Dispense Refill  . acetaminophen (TYLENOL) 325 MG tablet Take 2 tablets (650 mg total) by mouth every 6 (six) hours as needed for mild pain or headache (or Fever >/= 101).    Marland Kitchen apixaban (ELIQUIS) 5 MG TABS tablet Take 5 mg by mouth 2 (two) times daily.    . fluconazole  (DIFLUCAN) 200 MG tablet Take 1 tablet (200 mg total) by mouth once a week. (Patient taking differently: Take 200 mg by mouth every Monday. ) 24 tablet 0  . traMADol (ULTRAM) 50 MG tablet Take 1 tablet (50 mg total) by mouth every 8 (eight) hours as needed for moderate pain or severe pain. 15 tablet 0   No current facility-administered medications on file prior to visit.     Allergies:  No Known Allergies   Physical Exam  Vitals:   02/14/19 1022  BP: (!) 130/94  Pulse: 70   *Obtained by patient within his own home by his own device*  General: well developed, well nourished, pleasant young Caucasian male, seated, in no evident distress Head: head normocephalic and atraumatic.     Neurologic Exam Mental Status: Awake and fully alert. Oriented to place and time. Recent and remote memory intact. Attention span, concentration  and fund of knowledge appropriate. Mood and affect appropriate.  Cranial Nerves: Extraocular movements full without nystagmus. Hearing intact to voice. Facial sensation intact. Face, tongue, palate moves normally and symmetrically.  Motor: No evidence of weakness per drift assessment and able to stand on one leg without difficulty Sensory.:  Sensation to light touch intact throughout all extremities with assistance of his mother Coordination: Rapid alternating movements normal in all extremities. Finger-to-nose and heel-to-shin performed accurately bilaterally. Gait and Station: Arises from chair without difficulty. Stance is normal. Gait demonstrates normal stride length and balance. Able to heel, toe and tandem walk without difficulty.  Reflexes: Unable to test   NIHSS  0 Modified Rankin  0    Diagnostic Data (Labs, Imaging, Testing)  CT HEAD WO CONTRAST 12/30/2018 IMPRESSION: 1. No evidence of acute intracranial abnormality. 2. Increased density within the posterior superior sagittal sinus, straight sinus, and right transverse sinus. Findings may  represent dural venous sinus thrombosis. Head CTV or MRV is recommended.  CT VENOGRAM HEAD 12/30/2018 IMPRESSION: Confirmation of extensive venous thrombosis, involving the superior sagittal sinus, straight sinus, both transverse sinuses, the left sigmoid sinus, the left internal jugular vein and the left paravertebral vein.  MR BRAIN WO CONTRAST 12/31/2018 IMPRESSION: Extensive dural venous sinus thrombosis as established on CTV yesterday. No associated infarct or intracranial hemorrhage. No progressive thrombosis.    ASSESSMENT: GURSHAAN SNELLEN is a 23 y.o. 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  He has been doing well since discharge without residual or recurrent headache and denies any neurological stroke/TIA symptoms.    PLAN:  1. Venous sinus thrombosis: Continue Eliquis (apixaban) daily for duration of 6 to 9 months.  We will repeat CT venogram head around 04/2019 for recommended follow-up imaging. 2. Encouraged ongoing avoidance of energy drinks, electronic tobacco use or any other tobacco/nicotine use and avoidance of protein shakes at this time.  Highly encouraged continuation of adequate fluid intake and nutritional intake. 3. BP has been stable and recommended occasional monitoring by home device 4. He will continue to follow with his PCP for ongoing routine monitoring    Follow up in 6 months or call earlier if needed   Greater than 50% of time during this 25 minute visit was spent on counseling, explanation of diagnosis of venous sinus thrombosis, duration of anticoagulant and need of surveillance monitoring by use of CT venogram, educating on importance of sustaining from energy drinks, nicotine use and protein shakes at this time along with importance of adequate hydration and nutrition, planning of further management along with potential future management, and discussion with patient  and family answering all questions.    George Hugh, AGNP-BC  [facility] [address] [place], [place] [address]  Phone [phone] Fax [phone] Note: This document was prepared with digital dictation and possible smart phrase technology. Any transcriptional errors that result from this process are unintentional.

## 2019-02-16 ENCOUNTER — Telehealth: Payer: Self-pay | Admitting: Adult Health

## 2019-02-16 NOTE — Telephone Encounter (Signed)
[employer] Auth: [id number] (exp. 02/16/19 to 08/14/19) order sent to GI. They will reach out to the pt to schedule.

## 2019-02-17 NOTE — Telephone Encounter (Signed)
Noted, I will let her know.

## 2019-02-17 NOTE — Addendum Note (Signed)
Addended by: George Hugh on: 02/17/2019 10:33 AM   Modules accepted: Orders

## 2019-02-17 NOTE — Telephone Encounter (Signed)
CT venogram was placed due to follow-up of venous sinus thrombosis.  It was recommended to follow-up with CT venogram and not CTA head.

## 2019-02-17 NOTE — Telephone Encounter (Signed)
Leonard Crawford with [facility] informed me that the CT order needs to be CT Angio head w/wo contrast.

## 2019-02-20 NOTE — Progress Notes (Signed)
I agree with the above plan 

## 2019-03-08 ENCOUNTER — Telehealth: Payer: Self-pay | Admitting: Adult Health

## 2019-03-08 NOTE — Telephone Encounter (Signed)
2 VM left for pt to call and schedule f/u

## 2019-05-02 ENCOUNTER — Ambulatory Visit
Admission: RE | Admit: 2019-05-02 | Discharge: 2019-05-02 | Disposition: A | Discharge status: Home / Self Care | Payer: BC Managed Care – PPO | Source: Ambulatory Visit | Attending: Adult Health | Admitting: Adult Health

## 2019-05-02 ENCOUNTER — Other Ambulatory Visit: Payer: Self-pay

## 2019-05-02 DIAGNOSIS — G08 Intracranial and intraspinal phlebitis and thrombophlebitis: Secondary | ICD-10-CM

## 2019-05-02 MED ORDER — IOPAMIDOL (ISOVUE-370) INJECTION 76%
75.0000 mL | Freq: Once | INTRAVENOUS | Status: AC | PRN
  Administered 2019-05-02: 75 mL via INTRAVENOUS

## 2019-05-05 ENCOUNTER — Telehealth: Payer: Self-pay | Admitting: Neurology

## 2019-05-05 NOTE — Telephone Encounter (Signed)
Called the patient back and discussed the results with him and his mother. Informed them of the CT findings and that Leonard Crawford would recommend him continuing the medication for additional 2 months and then he could discontinue. They verbalized understanding and had no questions at this time. Very appreciative for the results.

## 2019-05-05 NOTE — Telephone Encounter (Signed)
Called the patient to review his results. No answer. LVM informing the patient to call back to discuss

## 2019-05-05 NOTE — Telephone Encounter (Signed)
Pt returning call please call back °

## 2019-05-05 NOTE — Telephone Encounter (Signed)
-----   Message from Venancio Poisson, NP sent at 05/03/2019 10:25 AM EDT ----- Please advise patient that his repeat CT venogram showed complete resolution of previously seen clots.  It is recommended to continue on Eliquis for a total of 6 months therefore advised to continue Eliquis for 2 additional months and then he can discontinue

## 2020-02-26 IMAGING — CT RADIOLOGY EXAMINATION
3 of 8 series · 16 of 47 positions shown, 19 images · IV contrast (iopamidol)
Comparison: 12/30/2018

CLINICAL DATA: Follow-up dural venous sinus thrombosis.

EXAM:
CT VENOGRAM HEAD
TECHNIQUE: Venous timed CT of the head was performed after bolus administration
of iodinated contrast
CONTRAST:  75mL [id number] IOPAMIDOL ([id number]) INJECTION 76%

[Series 5: head 0.60 hr40 s3 thins ibhc · axial · 0.45mm/px · z∈[-551,-395]mm · 11 of 288 slices shown, 14 images]
[im 14/288  brain]
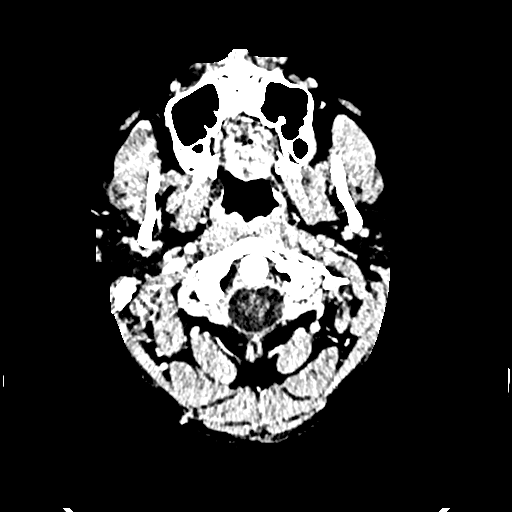
[im 14/288  bone]
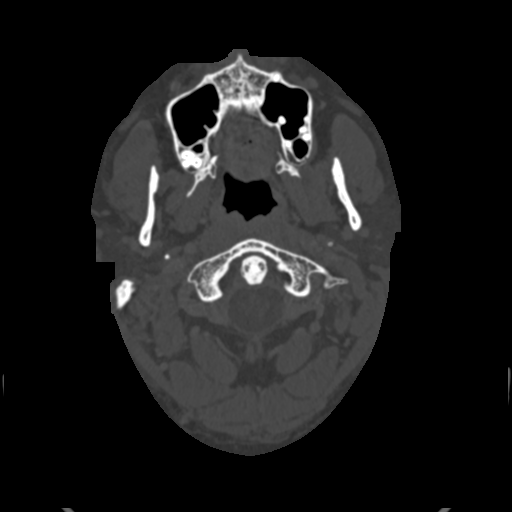
[im 42/288  brain]
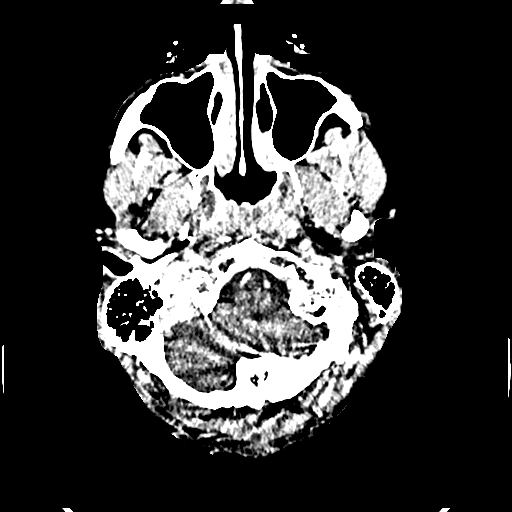
[im 69/288  brain]
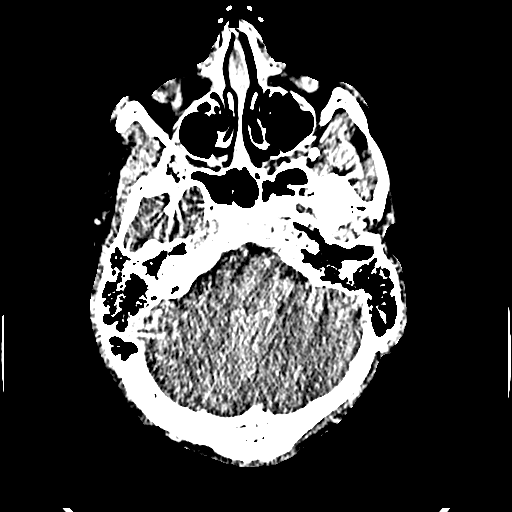
[im 96/288  brain]
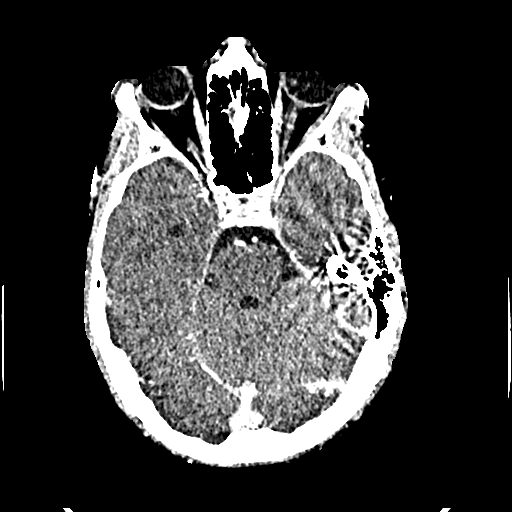
[im 124/288  brain]
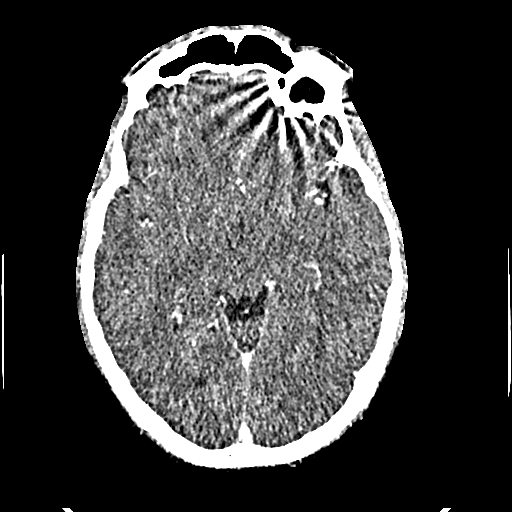
[im 124/288  bone]
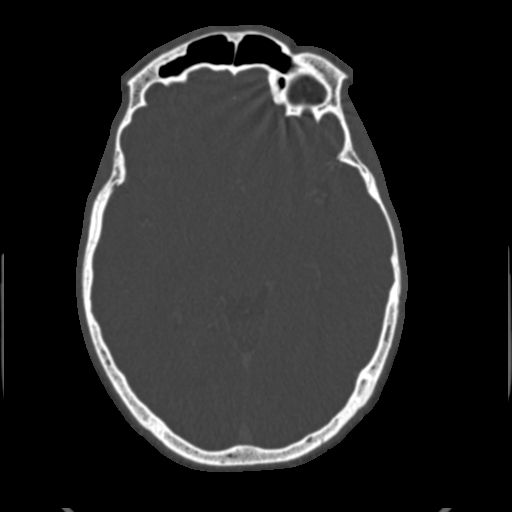
[im 151/288  brain]
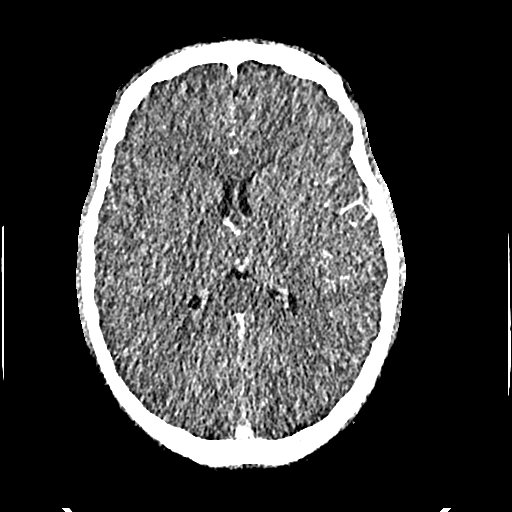
[im 165/288  brain]
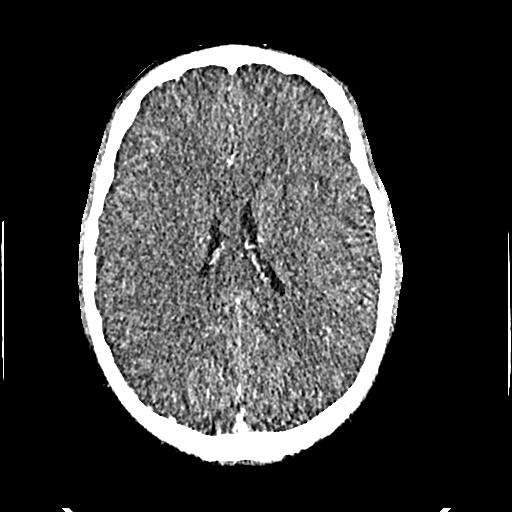
[im 192/288  brain]
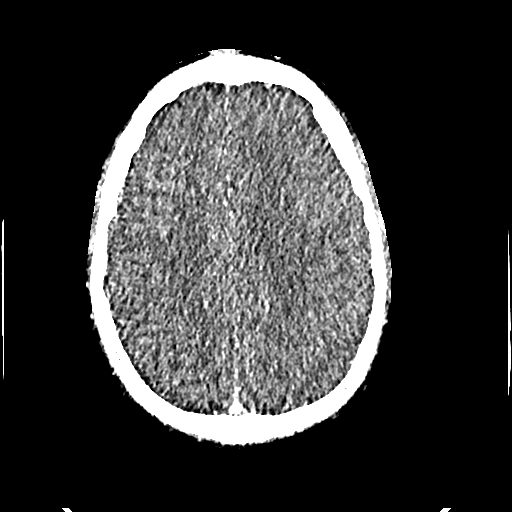
[im 219/288  brain]
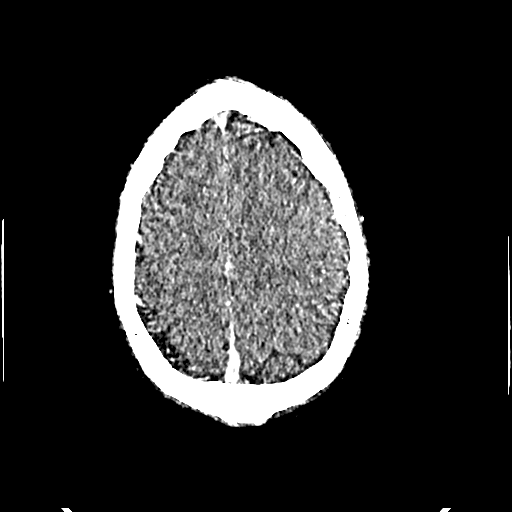
[im 219/288  bone]
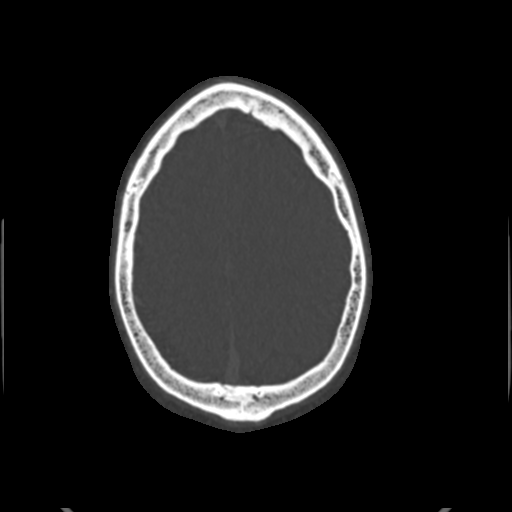
[im 247/288  brain]
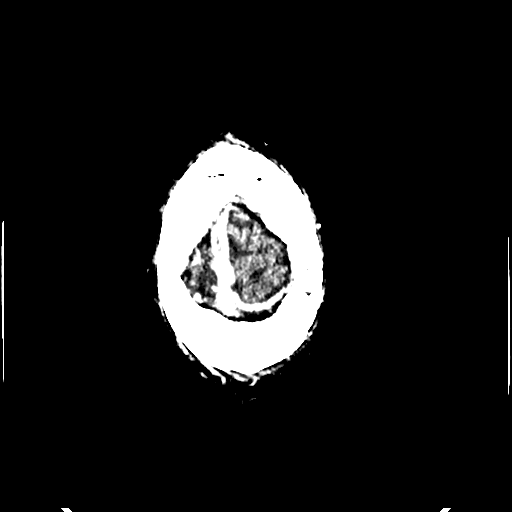
[im 274/288  brain]
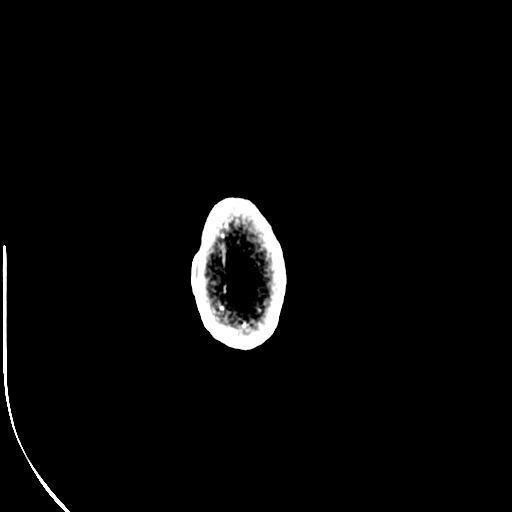

[Series 10: head 2.00 hr40 cor mpr ibhc · coronal · 0.33mm/px · 3 of 109 slices shown]
[im 28/109  brain]
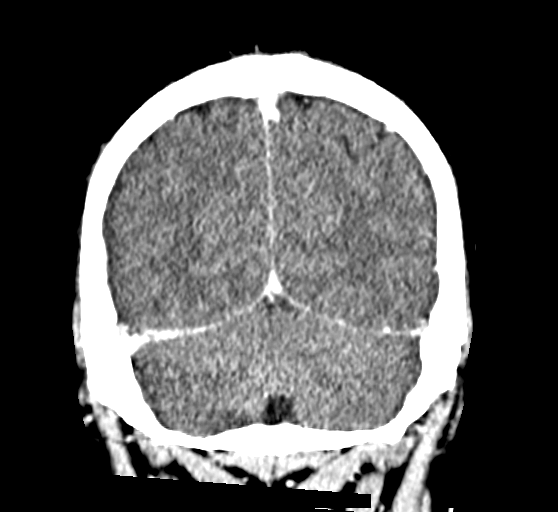
[im 55/109  brain]
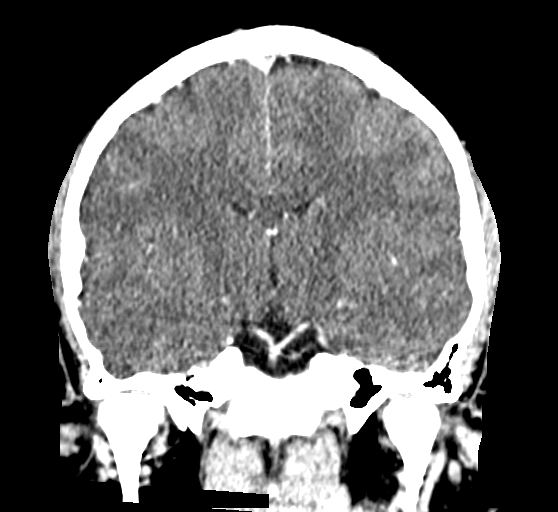
[im 82/109  brain]
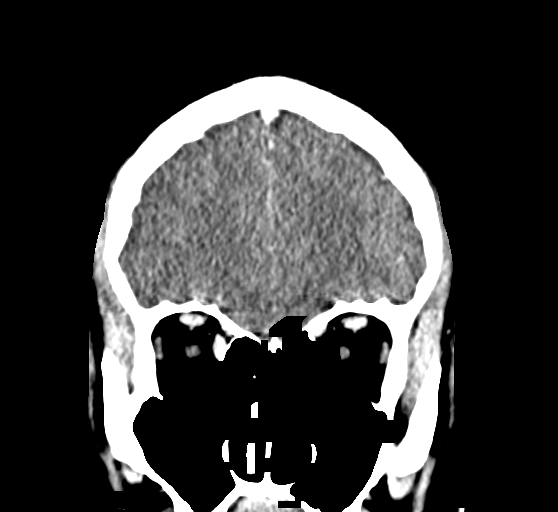

[Series 12: head 2.00 hr40 sag mpr ibhc · sagittal · 0.33mm/px · 2 of 92 slices shown]
[im 31/92  brain]
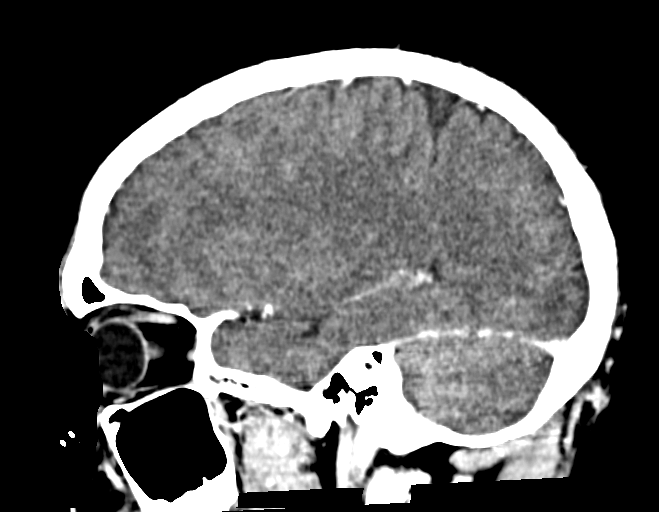
[im 61/92  brain]
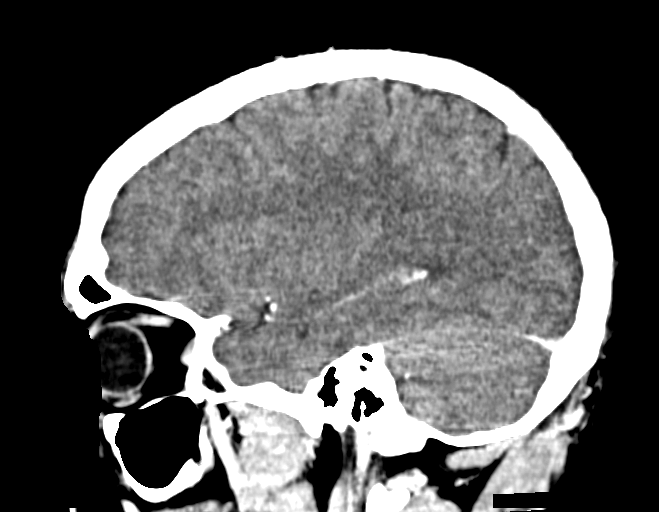

[16 of 47 positions shown; findings below may reference images not displayed]

FINDINGS: The brain has a normal appearance. No infarct or swelling. No
hydrocephalus or masslike finding.

The dural venous sinuses are diffusely patent. There is no evidence
of residual clot, web, or stricture. The right transverse sigmoid
sinus is dominant.

Negative skull, sinuses, and orbits.
IMPRESSION: Normalized CTV.

## 2022-12-19 DIAGNOSIS — Z23 Encounter for immunization: Secondary | ICD-10-CM | POA: Diagnosis not present

## 2022-12-19 DIAGNOSIS — L719 Rosacea, unspecified: Secondary | ICD-10-CM | POA: Diagnosis not present

## 2022-12-30 DIAGNOSIS — Z0000 Encounter for general adult medical examination without abnormal findings: Secondary | ICD-10-CM | POA: Diagnosis not present

## 2023-01-06 DIAGNOSIS — L719 Rosacea, unspecified: Secondary | ICD-10-CM | POA: Diagnosis not present

## 2023-01-06 DIAGNOSIS — Z0000 Encounter for general adult medical examination without abnormal findings: Secondary | ICD-10-CM | POA: Diagnosis not present

## 2023-05-21 DIAGNOSIS — L718 Other rosacea: Secondary | ICD-10-CM | POA: Diagnosis not present

## 2023-05-21 DIAGNOSIS — B078 Other viral warts: Secondary | ICD-10-CM | POA: Diagnosis not present

## 2023-05-21 DIAGNOSIS — Z789 Other specified health status: Secondary | ICD-10-CM | POA: Diagnosis not present

## 2023-05-21 DIAGNOSIS — L298 Other pruritus: Secondary | ICD-10-CM | POA: Diagnosis not present

## 2023-05-21 DIAGNOSIS — R238 Other skin changes: Secondary | ICD-10-CM | POA: Diagnosis not present

## 2023-06-25 DIAGNOSIS — L718 Other rosacea: Secondary | ICD-10-CM | POA: Diagnosis not present

## 2024-07-21 DIAGNOSIS — Z789 Other specified health status: Secondary | ICD-10-CM | POA: Diagnosis not present

## 2024-07-21 DIAGNOSIS — L538 Other specified erythematous conditions: Secondary | ICD-10-CM | POA: Diagnosis not present

## 2024-07-21 DIAGNOSIS — B078 Other viral warts: Secondary | ICD-10-CM | POA: Diagnosis not present

## 2024-07-21 DIAGNOSIS — L0889 Other specified local infections of the skin and subcutaneous tissue: Secondary | ICD-10-CM | POA: Diagnosis not present

## 2024-07-21 DIAGNOSIS — L718 Other rosacea: Secondary | ICD-10-CM | POA: Diagnosis not present
